# Patient Record
Sex: Female | Born: 1945 | Race: White | Hispanic: No | Marital: Married | Smoking: Current every day smoker
Health system: Southern US, Community
[De-identification: ages and names within clinical notes are randomized; demographics above are authoritative.]

## PROBLEM LIST (undated history)

## (undated) DIAGNOSIS — F419 Anxiety disorder, unspecified: Secondary | ICD-10-CM

## (undated) DIAGNOSIS — F329 Major depressive disorder, single episode, unspecified: Secondary | ICD-10-CM

## (undated) DIAGNOSIS — T8859XA Other complications of anesthesia, initial encounter: Secondary | ICD-10-CM

## (undated) DIAGNOSIS — Z972 Presence of dental prosthetic device (complete) (partial): Secondary | ICD-10-CM

## (undated) DIAGNOSIS — N189 Chronic kidney disease, unspecified: Secondary | ICD-10-CM

## (undated) DIAGNOSIS — F32A Depression, unspecified: Secondary | ICD-10-CM

## (undated) DIAGNOSIS — Z973 Presence of spectacles and contact lenses: Secondary | ICD-10-CM

## (undated) DIAGNOSIS — D649 Anemia, unspecified: Secondary | ICD-10-CM

## (undated) DIAGNOSIS — J449 Chronic obstructive pulmonary disease, unspecified: Secondary | ICD-10-CM

## (undated) DIAGNOSIS — N1832 Chronic kidney disease, stage 3b: Secondary | ICD-10-CM

## (undated) DIAGNOSIS — R112 Nausea with vomiting, unspecified: Secondary | ICD-10-CM

## (undated) DIAGNOSIS — Z9889 Other specified postprocedural states: Secondary | ICD-10-CM

## (undated) DIAGNOSIS — T4145XA Adverse effect of unspecified anesthetic, initial encounter: Secondary | ICD-10-CM

## (undated) DIAGNOSIS — J45909 Unspecified asthma, uncomplicated: Secondary | ICD-10-CM

## (undated) DIAGNOSIS — I10 Essential (primary) hypertension: Secondary | ICD-10-CM

## (undated) HISTORY — PX: HAMMER TOE SURGERY: SHX385

## (undated) HISTORY — PX: BREAST BIOPSY: SHX20

## (undated) HISTORY — PX: HERNIA REPAIR: SHX51

## (undated) HISTORY — PX: TONSILLECTOMY: SUR1361

## (undated) HISTORY — PX: TUBAL LIGATION: SHX77

---

## 2005-08-11 ENCOUNTER — Ambulatory Visit: Payer: Self-pay | Admitting: Internal Medicine

## 2008-06-26 ENCOUNTER — Ambulatory Visit: Payer: Self-pay | Admitting: Family Medicine

## 2011-03-17 ENCOUNTER — Ambulatory Visit: Payer: Self-pay | Admitting: Urology

## 2013-05-22 ENCOUNTER — Ambulatory Visit: Payer: Self-pay | Admitting: Urology

## 2013-05-22 DIAGNOSIS — I10 Essential (primary) hypertension: Secondary | ICD-10-CM

## 2013-05-27 ENCOUNTER — Ambulatory Visit: Payer: Self-pay | Admitting: Urology

## 2014-12-15 ENCOUNTER — Ambulatory Visit: Payer: Self-pay | Admitting: Family Medicine

## 2015-04-10 NOTE — H&P (Signed)
PATIENT NAME:  Kara Garza, Kara Garza MR#:  [id number] DATE OF BIRTH:  11-May-1946  DATE OF ADMISSION:  05/27/2013  CHIEF COMPLAINT: Urinary incontinence.   HISTORY OF PRESENT ILLNESS: Kara Garza is a 69 year old white female with a greater than 2-year history of stress urinary incontinence. She wears 3 to 4 midsized pads in a 24-hour period. She comes in now for Macroplastique injection. She denied history of urgency or urgency incontinence or urinary tract infections.   ALLERGIES: THE PATIENT IS ALLERGIC TO IV CONTRAST.   CURRENT MEDICATIONS: Include venlafaxine, lisinopril and aspirin.   PRIOR SURGICAL PROCEDURES: Included:  1. Tonsillectomy as a child.  2. Breast biopsy for benign disease in 1970s.  3. Repair of right femoral hernia in 1976.   SOCIAL HISTORY: She smokes a half-pack a day and has a 10 pack-year history. She denied alcohol use.   FAMILY HISTORY: Remarkable for father with diabetes and mother with lung cancer.   PAST AND CURRENT MEDICAL CONDITIONS:  1. Hypertension.  2. Anxiety.   REVIEW OF SYSTEMS: The patient has a history of allergic rhinitis and chronic low back pain. She has hot flashes. She denied weight loss, fatigue, fever, chills, night sweats, shortness of breath, diabetes, stroke or coronary artery disease.   PHYSICAL EXAMINATION:  GENERAL: A well-nourished white female in no distress.  HEENT: Sclerae were clear. Pupils were equally round and reactive to light and accommodation. Extraocular movements were intact.  NECK: Supple. No palpable cervical adenopathy. No audible carotid bruits.  LUNGS: Clear to auscultation.  CARDIOVASCULAR: Regular rhythm and rate without audible murmurs or gallops.  ABDOMEN: Soft, nontender abdomen.  GENITOURINARY: Normal external genitalia without vaginal discharge. She had good anterior support with minimal urethral hypermobility. She did have a positive Marshall test. Cervix was smooth without visible lesions. Uterus was smooth without  palpable nodules. No palpable adnexal masses.  RECTAL: No palpable rectal masses.  NEUROMUSCULAR: Alert and oriented x3.   IMPRESSION: Stress urinary incontinence.   PLAN: Macroplastique injection.     ____________________________ Suszanne ConnersMichael R. Evelene CroonWolff, MD mrw:OSi D: 05/22/2013 12:57:51 ET T: 05/22/2013 13:12:36 ET JOB#: [id number]  cc: Suszanne ConnersMichael R. Evelene CroonWolff, MD, <Dictator> Orson ApeMICHAEL R Anelis Hrivnak MD ELECTRONICALLY SIGNED 05/23/2013 8:56

## 2015-04-10 NOTE — Op Note (Signed)
PATIENT NAME:  Kara Garza, Kara Garza MR#:  [id number] DATE OF BIRTH:  June 20, 1946  DATE OF PROCEDURE:  05/27/2013  PREOPERATIVE DIAGNOSIS: Urinary incontinence.   POSTOPERATIVE DIAGNOSIS: Urinary incontinence.   PROCEDURE: Cystoscopy with Macroplastique implant.   SURGEON: Anola GurneyMichael Wolff, Garza.D.   ANESTHETISTS: Fabian Novemberhomas and Wolff.  ANESTHETIC METHOD: General per Maisie Fushomas and local per Evelene CroonWolff.   INDICATIONS: See the dictated history and physical. After informed consent, the patient requests the above procedure.   OPERATIVE SUMMARY: After adequate general anesthesia had been obtained, the patient was placed into dorsal lithotomy position and the perineum was prepped and draped in the usual fashion. The 17-French cystoscope was then coupled to the camera and then placed into the bladder. The bladder was thoroughly examined. Both ureteral orifices were identified and had clear efflux. No bladder mucosal lesions were identified. The cystoscope was then positioned in the mid urethra and the Macroplastique needle was passed.  Initial puncture was performed at the 12 o'clock position and 2.5 mL of Macroplastique was injected here. There was good elevation of the mucosa. The next injection was performed at the 6 o'clock position and an additional 2.5 mL injected here. At this point, there was complete coaptation of the bladder neck and mid urethra. The scope was then removed. Bladder was manually compressed to simulate Valsalva maneuver with full bladder. No leakage could be elicited. At this point, the bladder was drained with a 10-French red Robinson catheter. 10 mL of viscous Xylocaine was then instilled within the urethra and B and O suppository was placed. The procedure was then terminated, and the patient was transferred to the recovery room in stable condition.  ____________________________ Suszanne ConnersMichael R. Evelene CroonWolff, MD mrw:sb D: 05/27/2013 08:31:03 ET T: 05/27/2013 08:57:09 ET JOB#: [id number]  cc: Suszanne ConnersMichael R. Evelene CroonWolff, MD,  <Dictator> Orson ApeMICHAEL R WOLFF MD ELECTRONICALLY SIGNED 05/27/2013 12:31

## 2015-07-30 ENCOUNTER — Other Ambulatory Visit: Payer: Self-pay | Admitting: Urology

## 2015-07-30 DIAGNOSIS — N23 Unspecified renal colic: Secondary | ICD-10-CM

## 2015-07-30 DIAGNOSIS — N200 Calculus of kidney: Secondary | ICD-10-CM

## 2015-07-31 ENCOUNTER — Ambulatory Visit
Admission: RE | Admit: 2015-07-31 | Discharge: 2015-07-31 | Disposition: A | Discharge status: Home / Self Care | Payer: Medicare Other | Source: Ambulatory Visit | Attending: Urology | Admitting: Urology

## 2015-07-31 DIAGNOSIS — N200 Calculus of kidney: Secondary | ICD-10-CM

## 2015-07-31 DIAGNOSIS — N23 Unspecified renal colic: Secondary | ICD-10-CM | POA: Diagnosis present

## 2015-07-31 DIAGNOSIS — N281 Cyst of kidney, acquired: Secondary | ICD-10-CM | POA: Diagnosis not present

## 2015-12-02 DIAGNOSIS — F419 Anxiety disorder, unspecified: Secondary | ICD-10-CM | POA: Insufficient documentation

## 2016-04-05 ENCOUNTER — Other Ambulatory Visit: Payer: Medicare Other

## 2016-04-05 ENCOUNTER — Encounter: Payer: Self-pay | Admitting: *Deleted

## 2016-04-05 NOTE — Patient Instructions (Signed)
  Your procedure is scheduled on:04-12-16 (TUESDAY) Report to [facility] SAME [facility] SURGERY 2ND FLOOR To find out your arrival time please call [phone] between 1PM - 3PM on 04-11-16 (MONDAY)  Remember: Instructions that are not followed completely may result in serious medical risk, up to and including death, or upon the discretion of your surgeon and anesthesiologist your surgery may need to be rescheduled.    _X___ 1. Do not eat food or drink liquids after midnight. No gum chewing or hard candies.     _X___ 2. No Alcohol for 24 hours before or after surgery.   ____ 3. Bring all medications with you on the day of surgery if instructed.    _X___ 4. Notify your doctor if there is any change in your medical condition     (cold, fever, infections).     Do not wear jewelry, make-up, hairpins, clips or nail polish.  Do not wear lotions, powders, or perfumes. You may wear deodorant.  Do not shave 48 hours prior to surgery. Men may shave face and neck.  Do not bring valuables to the hospital.    [facility] is not responsible for any belongings or valuables.               Contacts, dentures or bridgework may not be worn into surgery.  Leave your suitcase in the car. After surgery it may be brought to your room.  For patients admitted to the hospital, discharge time is determined by your treatment team.   Patients discharged the day of surgery will not be allowed to drive home.   Please read over the following fact sheets that you were given:     _X___ Take these medicines the morning of surgery with A SIP OF WATER:    1. VENLAFAXINE  2.   3.   4.  5.  6.  ____ Fleet Enema (as directed)   ____ Use CHG Soap as directed  ____ Use inhalers on the day of surgery  ____ Stop metformin 2 days prior to surgery    ____ Take 1/2 of usual insulin dose the night before surgery and none on the morning of surgery.   _X___ Stop Coumadin/Plavix/aspirin-STOP ASPIRIN NOW  _X___ Stop  Anti-inflammatories-NO NSAIDS OR ASPIRIN PRODUCTS-TYLENOL OK TO TAKE   ____ Stop supplements until after surgery.    ____ Bring C-Pap to the hospital.

## 2016-04-06 ENCOUNTER — Encounter
Admission: RE | Admit: 2016-04-06 | Discharge: 2016-04-06 | Disposition: A | Discharge status: Home / Self Care | Payer: Medicare Other | Source: Ambulatory Visit | Attending: Physical Medicine and Rehabilitation | Admitting: Physical Medicine and Rehabilitation

## 2016-04-06 DIAGNOSIS — Z01810 Encounter for preprocedural cardiovascular examination: Secondary | ICD-10-CM | POA: Insufficient documentation

## 2016-04-06 LAB — POTASSIUM: Potassium: 3.9 mmol/L (ref 3.5–5.1)

## 2016-04-08 NOTE — H&P (Signed)
NAME:  Garza, Kara                       ACCOUNT NO.:  MEDICAL RECORD NO.:  [id number]  LOCATION:                                 FACILITY:  PHYSICIAN:  Anola GurneyMichael Brnadon Eoff          DATE OF BIRTH:  [birth year]  DATE OF ADMISSION: DATE OF DISCHARGE:                            HISTORY AND PHYSICAL   SAME DAY SURGERY:  April 12, 2016.  CHIEF COMPLAINT:  Urinary incontinence.  HISTORY OF PRESENT ILLNESS:  [name] is a 70 year old Caucasian female with a 6654-month history of stress urinary incontinence.  The incontinence occurs with coughing, sneezing, and straining.  She wears 1 to 2 Poise pads per day.  She does not have significant urge incontinence.  She does have some urgency.  OAB symptom score was 9.  She voids 2 times per night.  She comes in now for Macroplastique injection.  PAST MEDICAL HISTORY:  The patient is allergic to IV contrast.  CURRENT MEDICATIONS:  Include lisinopril, HCTZ, venlafaxine, vitamin D, and aspirin.  PREVIOUS SURGICAL PROCEDURES:  Include: 1. Tonsillectomy as a child. 2. Breast biopsy for benign disease in 1970s. 3. Repair of a right femoral hernia, 1976. 4. Macroplastique injection, 2014. 5. Repair of hammer toes, 1995.  PAST AND CURRENT MEDICAL CONDITIONS: 1. Hypertension. 2. Anxiety.  REVIEW OF SYSTEMS:  The patient has a history of allergic rhinitis and chronic low back pain.  She has hot flashes.  She denies chest pain, shortness of breath, diabetes, stroke, or heart disease.  PHYSICAL EXAMINATION:  GENERAL:  Well-nourished white female, in no acute distress. HEENT:  Sclerae were clear.  Pupils were equally round, reactive to light and accommodation.  Extraocular movements were intact. NECK:  Supple.  No palpable cervical adenopathy. LUNGS:  Clear to auscultation. CARDIOVASCULAR:  Regular rhythm and rate without audible murmurs. ABDOMINAL:  Soft, nontender abdomen. GU:  Revealed atrophic external genitalia.  She had a fixed urethra. She did  not have a cystocele.  IMPRESSION:  Type 3 stress incontinence.  PLAN:  Macroplastique injection.          ______________________________ Anola GurneyMichael Rollie Hynek     MW/MEDQ  D:  04/07/2016  T:  04/07/2016  Job:  [id number]

## 2016-04-12 ENCOUNTER — Ambulatory Visit: Payer: Medicare Other | Admitting: Anesthesiology

## 2016-04-12 ENCOUNTER — Encounter: Payer: Self-pay | Admitting: *Deleted

## 2016-04-12 ENCOUNTER — Ambulatory Visit
Admission: RE | Admit: 2016-04-12 | Discharge: 2016-04-12 | Disposition: A | Discharge status: Home / Self Care | Payer: Medicare Other | Source: Ambulatory Visit | Attending: Urology | Admitting: Urology

## 2016-04-12 ENCOUNTER — Encounter
Admission: RE | Disposition: A | Discharge status: Home / Self Care | Payer: Self-pay | Source: Ambulatory Visit | Attending: Urology

## 2016-04-12 DIAGNOSIS — Z9889 Other specified postprocedural states: Secondary | ICD-10-CM | POA: Insufficient documentation

## 2016-04-12 DIAGNOSIS — I10 Essential (primary) hypertension: Secondary | ICD-10-CM | POA: Insufficient documentation

## 2016-04-12 DIAGNOSIS — R32 Unspecified urinary incontinence: Secondary | ICD-10-CM | POA: Diagnosis present

## 2016-04-12 DIAGNOSIS — Z79899 Other long term (current) drug therapy: Secondary | ICD-10-CM | POA: Insufficient documentation

## 2016-04-12 DIAGNOSIS — Z7982 Long term (current) use of aspirin: Secondary | ICD-10-CM | POA: Diagnosis not present

## 2016-04-12 DIAGNOSIS — F419 Anxiety disorder, unspecified: Secondary | ICD-10-CM | POA: Insufficient documentation

## 2016-04-12 DIAGNOSIS — N393 Stress incontinence (female) (male): Secondary | ICD-10-CM | POA: Insufficient documentation

## 2016-04-12 DIAGNOSIS — Z91041 Radiographic dye allergy status: Secondary | ICD-10-CM | POA: Insufficient documentation

## 2016-04-12 HISTORY — DX: Chronic obstructive pulmonary disease, unspecified: J44.9

## 2016-04-12 HISTORY — DX: Anxiety disorder, unspecified: F41.9

## 2016-04-12 HISTORY — DX: Unspecified asthma, uncomplicated: J45.909

## 2016-04-12 HISTORY — DX: Depression, unspecified: F32.A

## 2016-04-12 HISTORY — DX: Anemia, unspecified: D64.9

## 2016-04-12 HISTORY — DX: Chronic kidney disease, unspecified: N18.9

## 2016-04-12 HISTORY — DX: Nausea with vomiting, unspecified: R11.2

## 2016-04-12 HISTORY — DX: Essential (primary) hypertension: I10

## 2016-04-12 HISTORY — PX: CYSTOSCOPY MACROPLASTIQUE IMPLANT: SHX6636

## 2016-04-12 HISTORY — DX: Other specified postprocedural states: Z98.890

## 2016-04-12 HISTORY — DX: Adverse effect of unspecified anesthetic, initial encounter: T41.45XA

## 2016-04-12 HISTORY — DX: Other complications of anesthesia, initial encounter: T88.59XA

## 2016-04-12 HISTORY — DX: Major depressive disorder, single episode, unspecified: F32.9

## 2016-04-12 HISTORY — DX: Nausea with vomiting, unspecified: Z98.890

## 2016-04-12 SURGERY — CYSTOSCOPY, WITH MACROPLASTIQUE INJECTION
Anesthesia: General | Wound class: Clean Contaminated

## 2016-04-12 MED ORDER — FAMOTIDINE 20 MG PO TABS
20.0000 mg | ORAL_TABLET | Freq: Once | ORAL | Status: AC
  Administered 2016-04-12: 20 mg via ORAL

## 2016-04-12 MED ORDER — DEXAMETHASONE SODIUM PHOSPHATE 10 MG/ML IJ SOLN
INTRAMUSCULAR | Status: DC | PRN
  Administered 2016-04-12: 5 mg via INTRAVENOUS

## 2016-04-12 MED ORDER — MIDAZOLAM HCL 5 MG/5ML IJ SOLN
INTRAMUSCULAR | Status: DC | PRN
  Administered 2016-04-12: 2 mg via INTRAVENOUS

## 2016-04-12 MED ORDER — PROPOFOL 10 MG/ML IV BOLUS
INTRAVENOUS | Status: DC | PRN
  Administered 2016-04-12: 150 mg via INTRAVENOUS
  Administered 2016-04-12: 30 mg via INTRAVENOUS

## 2016-04-12 MED ORDER — ONDANSETRON HCL 4 MG/2ML IJ SOLN
INTRAMUSCULAR | Status: DC | PRN
  Administered 2016-04-12: 4 mg via INTRAVENOUS

## 2016-04-12 MED ORDER — LACTATED RINGERS IV SOLN
INTRAVENOUS | Status: DC
  Administered 2016-04-12: 13:00:00 via INTRAVENOUS

## 2016-04-12 MED ORDER — LIDOCAINE HCL (CARDIAC) 20 MG/ML IV SOLN
INTRAVENOUS | Status: DC | PRN
  Administered 2016-04-12: 60 mg via INTRAVENOUS

## 2016-04-12 MED ORDER — UROGESIC-BLUE 81.6 MG PO TABS: 1.0000 | ORAL_TABLET | Freq: Four times a day (QID) | ORAL | Status: DC | PRN

## 2016-04-12 MED ORDER — FENTANYL CITRATE (PF) 100 MCG/2ML IJ SOLN: 25.0000 ug | INTRAMUSCULAR | Status: DC | PRN

## 2016-04-12 MED ORDER — BELLADONNA ALKALOIDS-OPIUM 16.2-60 MG RE SUPP
RECTAL | Status: DC | PRN
  Administered 2016-04-12: 1 via RECTAL

## 2016-04-12 MED ORDER — CEFAZOLIN SODIUM 1-5 GM-% IV SOLN
1.0000 g | Freq: Once | INTRAVENOUS | Status: AC
  Administered 2016-04-12: 1 g via INTRAVENOUS

## 2016-04-12 MED ORDER — ONDANSETRON HCL 4 MG/2ML IJ SOLN: 4.0000 mg | Freq: Once | INTRAMUSCULAR | Status: DC | PRN

## 2016-04-12 MED ORDER — LIDOCAINE HCL 2 % EX GEL
CUTANEOUS | Status: DC | PRN
  Administered 2016-04-12: 1 via URETHRAL

## 2016-04-12 MED ORDER — BELLADONNA ALKALOIDS-OPIUM 16.2-60 MG RE SUPP
RECTAL | Status: AC
  Filled 2016-04-12: qty 1

## 2016-04-12 MED ORDER — FENTANYL CITRATE (PF) 100 MCG/2ML IJ SOLN
INTRAMUSCULAR | Status: DC | PRN
  Administered 2016-04-12 (×4): 25 ug via INTRAVENOUS

## 2016-04-12 MED ORDER — LIDOCAINE HCL 2 % EX GEL
CUTANEOUS | Status: AC
  Filled 2016-04-12: qty 10

## 2016-04-12 SURGICAL SUPPLY — 21 items
BAG DRAIN CYSTO-URO LG1000N (MISCELLANEOUS) ×3 IMPLANT
CATH ROBINSON RED A/P 12FR (CATHETERS) ×3 IMPLANT
GLOVE BIO SURGEON STRL SZ7 (GLOVE) ×4 IMPLANT
GLOVE BIO SURGEON STRL SZ7.5 (GLOVE) ×3 IMPLANT
GOWN STRL REUS W/ TWL LRG LVL3 (GOWN DISPOSABLE) ×1 IMPLANT
GOWN STRL REUS W/ TWL XL LVL3 (GOWN DISPOSABLE) ×1 IMPLANT
GOWN STRL REUS W/TWL LRG LVL3 (GOWN DISPOSABLE) ×3
GOWN STRL REUS W/TWL XL LVL3 (GOWN DISPOSABLE) ×3
INJECTION MACROPLASIQ 2.5ML UN (Female Continence) ×6 IMPLANT
IV NS 1000ML (IV SOLUTION) ×3
IV NS 1000ML BAXH (IV SOLUTION) IMPLANT
KIT RM TURNOVER CYSTO AR (KITS) ×3 IMPLANT
MACROPLASTIQUE 2.5ML UNIT (Female Continence) ×6 IMPLANT
NDL ENDOSCOPIC URO 20G (NEEDLE) ×3 IMPLANT
OINTMENT BETADINE 1.5GM (MISCELLANEOUS) ×1 IMPLANT
PACK CYSTO AR (MISCELLANEOUS) ×3 IMPLANT
PREP PVP WINGED SPONGE (MISCELLANEOUS) ×3 IMPLANT
SET CYSTO W/LG BORE CLAMP LF (SET/KITS/TRAYS/PACK) ×3 IMPLANT
SURGILUBE 2OZ TUBE FLIPTOP (MISCELLANEOUS) ×3 IMPLANT
WATER STERILE IRR 1000ML POUR (IV SOLUTION) ×3 IMPLANT
WATER STERILE IRR 3000ML UROMA (IV SOLUTION) ×1 IMPLANT

## 2016-04-12 NOTE — Discharge Instructions (Addendum)
Urinary Incontinence Urinary incontinence is the involuntary loss of urine from your bladder. CAUSES  There are many causes of urinary incontinence. They include:  Medicines.  Infections.  Prostatic enlargement, leading to overflow of urine from your bladder.  Surgery.  Neurological diseases.  Emotional factors. SIGNS AND SYMPTOMS Urinary Incontinence can be divided into four types: 1. Urge incontinence. Urge incontinence is the involuntary loss of urine before you have the opportunity to go to the bathroom. There is a sudden urge to void but not enough time to reach a bathroom. 2. Stress incontinence. Stress incontinence is the sudden loss of urine with any activity that forces urine to pass. It is commonly caused by anatomical changes to the pelvis and sphincter areas of your body. 3. Overflow incontinence. Overflow incontinence is the loss of urine from an obstructed opening to your bladder. This results in a backup of urine and a resultant buildup of pressure within the bladder. When the pressure within the bladder exceeds the closing pressure of the sphincter, the urine overflows, which causes incontinence, similar to water overflowing a dam. 4. Total incontinence. Total incontinence is the loss of urine as a result of the inability to store urine within your bladder. DIAGNOSIS  Evaluating the cause of incontinence may require:  A thorough and complete medical and obstetric history.  A complete physical exam.  Laboratory tests such as a urine culture and sensitivities. When additional tests are indicated, they can include:  An ultrasound exam.  Kidney and bladder X-rays.  Cystoscopy. This is an exam of the bladder using a narrow scope.  Urodynamic testing to test the nerve function to the bladder and sphincter areas. TREATMENT  Treatment for urinary incontinence depends on the cause:  For urge incontinence caused by a bacterial infection, antibiotics will be prescribed.  If the urge incontinence is related to medicines you take, your health care provider may have you change the medicine.  For stress incontinence, surgery to re-establish anatomical support to the bladder or sphincter, or both, will often correct the condition.  For overflow incontinence caused by an enlarged prostate, an operation to open the channel through the enlarged prostate will allow the flow of urine out of the bladder. In women with fibroids, a hysterectomy may be recommended.  For total incontinence, surgery on your urinary sphincter may help. An artificial urinary sphincter (an inflatable cuff placed around the urethra) may be required. In women who have developed a hole-like passage between their bladder and vagina (vesicovaginal fistula), surgery to close the fistula often is required. HOME CARE INSTRUCTIONS  Normal daily hygiene and the use of pads or adult diapers that are changed regularly will help prevent odors and skin damage.  Avoid caffeine. It can overstimulate your bladder.  Use the bathroom regularly. Try about every 2-3 hours to go to the bathroom, even if you do not feel the need to do so. Take time to empty your bladder completely. After urinating, wait a minute. Then try to urinate again.  For causes involving nerve dysfunction, keep a log of the medicines you take and a journal of the times you go to the bathroom. SEEK MEDICAL CARE IF:  You experience worsening of pain instead of improvement in pain after your procedure.  Your incontinence becomes worse instead of better. SEE IMMEDIATE MEDICAL CARE IF:  You experience fever or shaking chills.  You are unable to pass your urine.  You have redness spreading into your groin or down into your thighs. MAKE SURE  YOU:   Understand these instructions.   Will watch your condition.  Will get help right away if you are not doing well or get worse.   This information is not intended to replace advice given to you  by your health care provider. Make sure you discuss any questions you have with your health care provider.   Document Released: 01/12/2005 Document Revised: 12/26/2014 Document Reviewed: 05/14/2013 Elsevier Interactive Patient Education 2016 [employer] Collagen Implant for Urinary Incontinence, Care After Read the instructions outlined below and refer to this sheet in the next few weeks. These discharge instructions provide you with general information on caring for yourself after you leave the hospital. Your caregiver may also give you specific instructions. While your treatment has been planned according to the most current medical practices available, unavoidable complications occasionally occur. If you have any problems or questions after discharge, call your caregiver. ACTIVITY  You may resume your regular activity.  Talk with your caregiver about when you may return to work.  You may shower. NUTRITION 5. You may resume your normal diet. 6. Drink plenty of fluids (6-8 glasses a day). 7. Eat a well-balanced diet. 8. Call your doctor if you have persistent nausea or vomiting. FEVER  If you feel feverish or have shaking chills, take your temperature. If it is 102 F (38.9 C), call your doctor.  Fever may mean there is an infection. PAIN CONTROL  Only take over-the-counter or prescription medicines for pain, discomfort, or fever as directed by your caregiver. SYMPTOMS AFTER SURGERY You may experience one or more of the following symptoms after your implant procedure. These symptoms should be only temporary:  Inability to urinate or a change in your usual pattern of urination.  Slight bleeding at the urethra or blood in the urine.  Soreness in the injection site area.  Nausea. SEXUAL INTERCOURSE Check with your doctor at your follow-up visit about resuming sexual intercourse. SEEK IMMEDIATE MEDICAL CARE IF:   Cloudy foul-smelling urine.  Fever greater than 102 F (38.9  C).  Joint pain or joint swelling.  Skin rash or skin thickening.  Muscle aches.  Weakness or fatigue.  You are unable to urinate.   This information is not intended to replace advice given to you by your health care provider. Make sure you discuss any questions you have with your health care provider.   Document Released: 07/19/2004 Document Revised: 09/25/2013 Document Reviewed: 11/05/2014 Elsevier Interactive Patient Education 2016 [employer] Inc[employer]   AMBULATORY SURGERY  DISCHARGE INSTRUCTIONS   1) The drugs that you were given will stay in your system until tomorrow so for the next 24 hours you should not:  A) Drive an automobile B) Make any legal decisions C) Drink any alcoholic beverage   2) You may resume regular meals tomorrow.  Today it is better to start with liquids and gradually work up to solid foods.  You may eat anything you prefer, but it is better to start with liquids, then soup and crackers, and gradually work up to solid foods.   3) Please notify your doctor immediately if you have any unusual bleeding, trouble breathing, redness and pain at the surgery site, drainage, fever, or pain not relieved by medication.    4) Additional Instructions:        Please contact your physician with any problems or Same Day Surgery at [phone], Monday through Friday 6 am to 4 pm, or [place] at [facility] Main number at [phone].

## 2016-04-12 NOTE — Anesthesia Postprocedure Evaluation (Signed)
Anesthesia Post Note  Patient: Nyoka CowdenLaura H Hovatter  Procedure(s) Performed: Procedure(s) (LRB): CYSTOSCOPY MACROPLASTIQUE IMPLANT (N/A)  Patient location during evaluation: PACU Anesthesia Type: General Level of consciousness: awake and alert Pain management: pain level controlled Vital Signs Assessment: post-procedure vital signs reviewed and stable Respiratory status: spontaneous breathing, nonlabored ventilation, respiratory function stable and patient connected to nasal cannula oxygen Cardiovascular status: blood pressure returned to baseline and stable Postop Assessment: no signs of nausea or vomiting Anesthetic complications: no    Last Vitals:  Filed Vitals:   04/12/16 1445 04/12/16 1500  BP: 148/73 147/55  Pulse: 73 81  Temp:  36.6 C  Resp: 15 14    Last Pain:  Filed Vitals:   04/12/16 1502  PainSc: 0-No pain                 Lenard SimmerAndrew Radames Mejorado

## 2016-04-12 NOTE — Op Note (Signed)
Preoperative diagnosis: Urinary incontinence   Postoperative diagnosis: Same  Procedure: Macroplastique injection   Surgeon: Suszanne ConnersMichael R. Evelene CroonWolff MD, FACS Anesthesia: Gen.  Indications:See the history and physical. After informed consent the above procedure(s) were requested     Technique and findings: After adequate general anesthesia had been obtained the patient was placed into dorsal lithotomy position and the perineum was prepped and draped in the usual fashion. The 3921 French the scope was coupled the camera and then advanced into the bladder. The bladder was thoroughly inspected. Both ureteral orifices were identified and had clear reflux. No bladder tumors were identified. The scope was positioned in mid urethra and the urethral mucosa did not to coapt. The Macroplastique needle was introduced through the scope and initial puncture performed at 3:00 position. 2 cc of plastic was injected submucosally and there was good elevation of the mucosa on this side. A second puncture was performed at the 9:00 position and 2 cc injected here. There was good elevation on this side as well. A third puncture was performed at the exit o'clock position and 1 cc injected here which resulted in complete coaptation of the mucosa circumferentially. At this point the scope was removed. The bladder was compressed manually and stress incontinence could not be elicited. A 10 French red Robinson catheter was placed and the bladder was drained. 10 cc of viscous Xylocaine was instilled within the urethra. A B&O suppository was placed. The procedure was then terminated and patient transferred to the recovery room in stable condition.

## 2016-04-12 NOTE — H&P (Signed)
Date of Initial H&P: 04/07/16  History reviewed, patient examined, no change in status, stable for surgery.

## 2016-04-12 NOTE — Anesthesia Procedure Notes (Signed)
Procedure Name: LMA Insertion Date/Time: 04/12/2016 1:56 PM Performed by: Lily KocherPERALTA, Kara Courtney Pre-anesthesia Checklist: Patient identified, Patient being monitored, Timeout performed, Emergency Drugs available and Suction available Patient Re-evaluated:Patient Re-evaluated prior to inductionOxygen Delivery Method: Circle system utilized Preoxygenation: Pre-oxygenation with 100% oxygen Intubation Type: IV induction Ventilation: Mask ventilation without difficulty LMA: LMA inserted LMA Size: 3.0 Tube type: Oral Number of attempts: 1 Placement Confirmation: positive ETCO2 Tube secured with: Tape Dental Injury: Teeth and Oropharynx as per pre-operative assessment

## 2016-04-12 NOTE — Transfer of Care (Signed)
Immediate Anesthesia Transfer of Care Note  Patient: Kara CowdenLaura H Garza  Procedure(s) Performed: Procedure(s): CYSTOSCOPY MACROPLASTIQUE IMPLANT (N/A)  Patient Location: PACU  Anesthesia Type:General  Level of Consciousness: sedated  Airway & Oxygen Therapy: Patient Spontanous Breathing and Patient connected to face mask oxygen  Post-op Assessment: Report given to RN  Post vital signs: Reviewed and stable  Last Vitals:  Filed Vitals:   04/12/16 1249 04/12/16 1430  BP: 181/72 185/81  Pulse: 82 79  Temp: 36.7 C 36.2 C  Resp: 16 23    Complications: No apparent anesthesia complications

## 2016-04-12 NOTE — OR Nursing (Signed)
Glasses and dentures given back to patient.

## 2016-04-12 NOTE — Anesthesia Preprocedure Evaluation (Signed)
Anesthesia Evaluation  Patient identified by MRN, date of birth, ID band Patient awake    Reviewed: Allergy & Precautions, H&P , NPO status , Patient's Chart, lab work & pertinent test results, reviewed documented beta blocker date and time   History of Anesthesia Complications (+) PONV and history of anesthetic complications  Airway Mallampati: II  TM Distance: >3 FB Neck ROM: full    Dental no notable dental hx. (+) Partial Lower, Missing, Caps, Teeth Intact   Pulmonary neg shortness of breath, asthma , neg sleep apnea, COPD, Recent URI , Residual Cough, Current Smoker,    Pulmonary exam normal breath sounds clear to auscultation       Cardiovascular Exercise Tolerance: Good hypertension, On Medications (-) angina(-) CAD, (-) Past MI, (-) Cardiac Stents and (-) CABG Normal cardiovascular exam(-) dysrhythmias (-) Valvular Problems/Murmurs Rhythm:regular Rate:Normal     Neuro/Psych PSYCHIATRIC DISORDERS (Depression) negative neurological ROS     GI/Hepatic negative GI ROS, Neg liver ROS,   Endo/Other  negative endocrine ROS  Renal/GU CRFRenal disease  negative genitourinary   Musculoskeletal   Abdominal   Peds  Hematology negative hematology ROS (+)   Anesthesia Other Findings Past Medical History:   Hypertension                                                 Anxiety                                                      Depression                                                   Chronic kidney disease                                         Comment:H/O KIDNEY STONES   Anemia                                                         Comment:H/O   Complication of anesthesia                                   PONV (postoperative nausea and vomiting)                       Comment:NAUSEATED   COPD (chronic obstructive pulmonary disease) (*                Comment:PT DENIES BUT PCP HAS LISTED ON H&P   Asthma  Comment:PT DENIES BUT PCP HAS LISTED ON H&P   Reproductive/Obstetrics negative OB ROS                             Anesthesia Physical Anesthesia Plan  ASA: III  Anesthesia Plan: General   Post-op Pain Management:    Induction:   Airway Management Planned:   Additional Equipment:   Intra-op Plan:   Post-operative Plan:   Informed Consent: I have reviewed the patients History and Physical, chart, labs and discussed the procedure including the risks, benefits and alternatives for the proposed anesthesia with the patient or authorized representative who has indicated his/her understanding and acceptance.   Dental Advisory Given  Plan Discussed with: Anesthesiologist, CRNA and Surgeon  Anesthesia Plan Comments:         Anesthesia Quick Evaluation

## 2018-07-02 ENCOUNTER — Other Ambulatory Visit: Payer: Self-pay | Admitting: Family Medicine

## 2018-07-02 DIAGNOSIS — Z1231 Encounter for screening mammogram for malignant neoplasm of breast: Secondary | ICD-10-CM

## 2019-01-02 ENCOUNTER — Ambulatory Visit
Admission: EM | Admit: 2019-01-02 | Discharge: 2019-01-02 | Disposition: A | Discharge status: Home / Self Care | Payer: Medicare Other | Attending: Family Medicine | Admitting: Family Medicine

## 2019-01-02 ENCOUNTER — Other Ambulatory Visit: Payer: Self-pay

## 2019-01-02 DIAGNOSIS — R05 Cough: Secondary | ICD-10-CM | POA: Diagnosis not present

## 2019-01-02 DIAGNOSIS — J069 Acute upper respiratory infection, unspecified: Secondary | ICD-10-CM | POA: Insufficient documentation

## 2019-01-02 DIAGNOSIS — F17210 Nicotine dependence, cigarettes, uncomplicated: Secondary | ICD-10-CM | POA: Diagnosis not present

## 2019-01-02 DIAGNOSIS — J441 Chronic obstructive pulmonary disease with (acute) exacerbation: Secondary | ICD-10-CM | POA: Insufficient documentation

## 2019-01-02 DIAGNOSIS — B9789 Other viral agents as the cause of diseases classified elsewhere: Secondary | ICD-10-CM | POA: Insufficient documentation

## 2019-01-02 MED ORDER — ALBUTEROL SULFATE HFA 108 (90 BASE) MCG/ACT IN AERS: 2.0000 | INHALATION_SPRAY | RESPIRATORY_TRACT | 0 refills | Status: DC | PRN

## 2019-01-02 MED ORDER — BENZONATATE 100 MG PO CAPS: 100.0000 mg | ORAL_CAPSULE | Freq: Three times a day (TID) | ORAL | 0 refills | Status: DC | PRN

## 2019-01-02 MED ORDER — PREDNISONE 10 MG PO TABS: ORAL_TABLET | ORAL | 0 refills | Status: DC

## 2019-01-02 MED ORDER — HYDROCOD POLST-CPM POLST ER 10-8 MG/5ML PO SUER: 5.0000 mL | Freq: Every evening | ORAL | 0 refills | Status: DC | PRN

## 2019-01-02 MED ORDER — DOXYCYCLINE HYCLATE 100 MG PO CAPS: 100.0000 mg | ORAL_CAPSULE | Freq: Two times a day (BID) | ORAL | 0 refills | Status: DC

## 2019-01-02 NOTE — ED Triage Notes (Signed)
"  I have bronchitis"  Coughing x one week of green sputum

## 2019-01-02 NOTE — Discharge Instructions (Addendum)
Take medication as prescribed. Rest. Drink plenty of fluids.  ° °Follow up with your primary care physician this week as needed. Return to Urgent care for new or worsening concerns.  ° °

## 2019-01-02 NOTE — ED Provider Notes (Signed)
[facility] ____________________________________________  Time seen: Approximately 12:06 PM  I have reviewed the triage vital signs and the nursing notes.   HISTORY  Chief Complaint Cough   HPI Kara Garza is a 73 y.o. female past medical history of recurrent bronchitis, COPD, hypertension presenting for evaluation of 1 week of runny nose, nasal congestion, sinus pressure, cough and intermittent wheezing.  States cough is her biggest complaint.  States intermittently feels sore from coughing.  Denies accompanying chest pain or shortness of breath.  States cough is productive of greenish mucus.  States believes she picked up a cold to start this from working at [employer].  Denies sore throat.  Denies known fevers.  Has continued to remain active.  Continues to overall eat and drink well.  Denies recent sickness or recent COPD exacerbation.  No recent prednisone use.  States no longer has albuterol inhalers at home to use if needed.  Reports otherwise doing well.  Dorothey Baseman, MD: PCP  Past Medical History:  Diagnosis Date  . Anemia    H/O  . Anxiety   . Asthma    PT DENIES BUT PCP HAS LISTED ON H&P  . Chronic kidney disease    H/O KIDNEY STONES  . Complication of anesthesia   . COPD (chronic obstructive pulmonary disease) (HCC)    PT DENIES BUT PCP HAS LISTED ON H&P  . Depression   . Hypertension   . PONV (postoperative nausea and vomiting)    NAUSEATED    There are no active problems to display for this patient.   Past Surgical History:  Procedure Laterality Date  . BREAST BIOPSY    . CYSTOSCOPY MACROPLASTIQUE IMPLANT N/A 04/12/2016   Procedure: CYSTOSCOPY MACROPLASTIQUE IMPLANT;  Surgeon: Orson Ape, MD;  Location: [facility] ORS;  Service: Urology;  Laterality: N/A;  . HAMMER TOE SURGERY    . HERNIA REPAIR    . TONSILLECTOMY    . TUBAL LIGATION       No current facility-administered medications for this encounter.   Current Outpatient  Medications:  .  gabapentin (NEURONTIN) 300 MG capsule, Take 2 to 3 capsule 3 times a day., Disp: , Rfl:  .  Secukinumab (COSENTYX [place]), Inject into the skin., Disp: , Rfl:  .  albuterol (PROVENTIL HFA;VENTOLIN HFA) 108 (90 Base) MCG/ACT inhaler, Inhale 2 puffs into the lungs every 4 (four) hours as needed for wheezing., Disp: 1 Inhaler, Rfl: 0 .  benzonatate (TESSALON PERLES) 100 MG capsule, Take 1 capsule (100 mg total) by mouth 3 (three) times daily as needed., Disp: 15 capsule, Rfl: 0 .  chlorpheniramine-HYDROcodone (TUSSIONEX PENNKINETIC ER) 10-8 MG/5ML SUER, Take 5 mLs by mouth at bedtime as needed for cough. do not drive or operate machinery while taking as can cause drowsiness., Disp: 30 mL, Rfl: 0 .  doxycycline (VIBRAMYCIN) 100 MG capsule, Take 1 capsule (100 mg total) by mouth 2 (two) times daily., Disp: 20 capsule, Rfl: 0 .  lisinopril-hydrochlorothiazide (PRINZIDE,ZESTORETIC) 20-12.5 MG tablet, Take 1 tablet by mouth daily., Disp: , Rfl:  .  Methen-Hyosc-Meth Blue-Na Phos (UROGESIC-BLUE) 81.6 MG TABS, Take 1 tablet (81.6 mg total) by mouth every 6 (six) hours as needed (dysuria)., Disp: 40 tablet, Rfl: 3 .  predniSONE (DELTASONE) 10 MG tablet, Start 60 mg po day one, then 50 mg po day two, taper by 10 mg daily until complete., Disp: 21 tablet, Rfl: 0 .  venlafaxine (EFFEXOR) 37.5 MG tablet, Take 37.5 mg by mouth every morning., Disp: ,  Rfl:   Allergies Ivp dye [iodinated diagnostic agents]  History reviewed. No pertinent family history.  Social History Social History   Tobacco Use  . Smoking status: Current Every Day Smoker    Packs/day: 1.00    Years: 25.00    Pack years: 25.00    Types: Cigarettes  . Smokeless tobacco: Never Used  Substance Use Topics  . Alcohol use: Yes    Comment: GLASS OF WINE AT BEDTIME  . Drug use: No    Review of Systems Constitutional: No fever ENT: No sore throat. As above.  Cardiovascular: Denies chest pain. Respiratory: Denies shortness of  breath. Gastrointestinal: No abdominal pain.   Musculoskeletal: Negative for back pain. Skin: Negative for rash.   ____________________________________________   PHYSICAL EXAM:  VITAL SIGNS: ED Triage Vitals  Enc Vitals Group     BP 01/02/19 1105 (!) 154/53     Pulse Rate 01/02/19 1105 94     Resp 01/02/19 1105 18     Temp 01/02/19 1105 98.8 F (37.1 C)     Temp Source 01/02/19 1105 Oral     SpO2 01/02/19 1105 95 %     Weight 01/02/19 1108 165 lb (74.8 kg)     Height 01/02/19 1108 5\' 5"  (1.651 m)     Head Circumference --      Peak Flow --      Pain Score 01/02/19 1108 3     Pain Loc --      Pain Edu? --      Excl. in GC? --     Constitutional: Alert and oriented. Well appearing and in no acute distress. Eyes: Conjunctivae are normal.  Head: Atraumatic. No sinus tenderness to palpation. No swelling. No erythema.  Ears: no erythema, normal TMs bilaterally.   Nose:Nasal congestion  Mouth/Throat: Mucous membranes are moist. No pharyngeal erythema. No tonsillar swelling or exudate.  Neck: No stridor.  No cervical spine tenderness to palpation. Hematological/Lymphatic/Immunilogical: No cervical lymphadenopathy. Cardiovascular: Normal rate, regular rhythm. Grossly normal heart sounds.  Good peripheral circulation. Respiratory: Normal respiratory effort.  No retractions.  Mild scattered rhonchi.  Minimal scattered expiratory wheeze.  Good air movement.  Dry intermittent cough with bronchospasm.  Speaks in complete sentences. Musculoskeletal: Ambulatory with steady gait.  No lower extremity edema noted. Neurologic:  Normal speech and language. No gait instability. Skin:  Skin appears warm, dry and intact. No rash noted. Psychiatric: Mood and affect are normal. Speech and behavior are normal. ___________________________________________   LABS (all labs ordered are listed, but only abnormal results are displayed)  Labs Reviewed - No data to display   PROCEDURES Procedures      INITIAL IMPRESSION / ASSESSMENT AND PLAN / ED COURSE  Pertinent labs & imaging results that were available during my care of the patient were reviewed by me and considered in my medical decision making (see chart for details).  Well-appearing patient.  No acute distress.  Suspect recent viral upper respiratory infection with COPD exacerbation.  Will treat with prednisone taper, PRN albuterol inhaler, doxycycline, PRN Tessalon Perles and PRN Tussionex.  Encourage rest, fluids, supportive care.Discussed indication, risks and benefits of medications with patient.  Discussed follow up with Primary care physician this week. Discussed follow up and return parameters including no resolution or any worsening concerns. Patient verbalized understanding and agreed to plan.   ____________________________________________   FINAL CLINICAL IMPRESSION(S) / ED DIAGNOSES  Final diagnoses:  COPD exacerbation (HCC)  Viral URI with cough     ED  Discharge Orders         Ordered    doxycycline (VIBRAMYCIN) 100 MG capsule  2 times daily     01/02/19 1141    predniSONE (DELTASONE) 10 MG tablet     01/02/19 1141    chlorpheniramine-HYDROcodone (TUSSIONEX PENNKINETIC ER) 10-8 MG/5ML SUER  At bedtime PRN     01/02/19 1141    benzonatate (TESSALON PERLES) 100 MG capsule  3 times daily PRN     01/02/19 1141    albuterol (PROVENTIL HFA;VENTOLIN HFA) 108 (90 Base) MCG/ACT inhaler  Every 4 hours PRN     01/02/19 1141           Note: This dictation was prepared with Dragon dictation along with smaller phrase technology. Any transcriptional errors that result from this process are unintentional.         Renford Dills, NP 01/02/19 1209

## 2020-01-09 ENCOUNTER — Other Ambulatory Visit: Payer: Self-pay | Admitting: Urology

## 2020-01-09 DIAGNOSIS — N200 Calculus of kidney: Secondary | ICD-10-CM

## 2020-01-09 DIAGNOSIS — N390 Urinary tract infection, site not specified: Secondary | ICD-10-CM

## 2020-01-21 ENCOUNTER — Ambulatory Visit: Admission: RE | Admit: 2020-01-21 | Payer: Medicare Other | Source: Ambulatory Visit

## 2020-01-22 ENCOUNTER — Other Ambulatory Visit: Payer: Self-pay

## 2020-01-22 ENCOUNTER — Ambulatory Visit
Admission: RE | Admit: 2020-01-22 | Discharge: 2020-01-22 | Disposition: A | Discharge status: Home / Self Care | Payer: Medicare Other | Source: Ambulatory Visit | Attending: Urology | Admitting: Urology

## 2020-01-22 DIAGNOSIS — N390 Urinary tract infection, site not specified: Secondary | ICD-10-CM | POA: Insufficient documentation

## 2020-01-22 DIAGNOSIS — N200 Calculus of kidney: Secondary | ICD-10-CM | POA: Insufficient documentation

## 2020-02-06 ENCOUNTER — Encounter: Payer: Self-pay | Admitting: Emergency Medicine

## 2020-02-06 ENCOUNTER — Emergency Department: Payer: Medicare Other

## 2020-02-06 ENCOUNTER — Other Ambulatory Visit: Payer: Self-pay

## 2020-02-06 ENCOUNTER — Emergency Department
Admission: EM | Admit: 2020-02-06 | Discharge: 2020-02-06 | Disposition: A | Discharge status: Home / Self Care | Payer: Medicare Other | Attending: Emergency Medicine | Admitting: Emergency Medicine

## 2020-02-06 DIAGNOSIS — E860 Dehydration: Secondary | ICD-10-CM | POA: Insufficient documentation

## 2020-02-06 DIAGNOSIS — Z20822 Contact with and (suspected) exposure to covid-19: Secondary | ICD-10-CM | POA: Insufficient documentation

## 2020-02-06 DIAGNOSIS — R531 Weakness: Secondary | ICD-10-CM

## 2020-02-06 DIAGNOSIS — J449 Chronic obstructive pulmonary disease, unspecified: Secondary | ICD-10-CM | POA: Diagnosis not present

## 2020-02-06 DIAGNOSIS — F17210 Nicotine dependence, cigarettes, uncomplicated: Secondary | ICD-10-CM | POA: Diagnosis not present

## 2020-02-06 DIAGNOSIS — N189 Chronic kidney disease, unspecified: Secondary | ICD-10-CM | POA: Diagnosis not present

## 2020-02-06 DIAGNOSIS — I129 Hypertensive chronic kidney disease with stage 1 through stage 4 chronic kidney disease, or unspecified chronic kidney disease: Secondary | ICD-10-CM | POA: Insufficient documentation

## 2020-02-06 DIAGNOSIS — J45909 Unspecified asthma, uncomplicated: Secondary | ICD-10-CM | POA: Diagnosis not present

## 2020-02-06 LAB — URINALYSIS, COMPLETE (UACMP) WITH MICROSCOPIC
Bacteria, UA: NONE SEEN
Bilirubin Urine: NEGATIVE
Glucose, UA: NEGATIVE mg/dL
Hgb urine dipstick: NEGATIVE
Ketones, ur: NEGATIVE mg/dL
Nitrite: NEGATIVE
Protein, ur: NEGATIVE mg/dL
Specific Gravity, Urine: 1.02 (ref 1.005–1.030)
pH: 5 (ref 5.0–8.0)

## 2020-02-06 LAB — CBC
HCT: 43.4 % (ref 36.0–46.0)
Hemoglobin: 14.1 g/dL (ref 12.0–15.0)
MCH: 30.9 pg (ref 26.0–34.0)
MCHC: 32.5 g/dL (ref 30.0–36.0)
MCV: 95 fL (ref 80.0–100.0)
Platelets: 333 10*3/uL (ref 150–400)
RBC: 4.57 MIL/uL (ref 3.87–5.11)
RDW: 13.1 % (ref 11.5–15.5)
WBC: 10.6 10*3/uL — ABNORMAL HIGH (ref 4.0–10.5)
nRBC: 0 % (ref 0.0–0.2)

## 2020-02-06 LAB — BASIC METABOLIC PANEL
Anion gap: 8 (ref 5–15)
Anion gap: 5 (ref 5–15)
BUN: 40 mg/dL — ABNORMAL HIGH (ref 8–23)
BUN: 38 mg/dL — ABNORMAL HIGH (ref 8–23)
CO2: 26 mmol/L (ref 22–32)
CO2: 23 mmol/L (ref 22–32)
Calcium: 9.8 mg/dL (ref 8.9–10.3)
Calcium: 8.7 mg/dL — ABNORMAL LOW (ref 8.9–10.3)
Chloride: 103 mmol/L (ref 98–111)
Chloride: 111 mmol/L (ref 98–111)
Creatinine, Ser: 1.97 mg/dL — ABNORMAL HIGH (ref 0.44–1.00)
Creatinine, Ser: 1.45 mg/dL — ABNORMAL HIGH (ref 0.44–1.00)
GFR calc Af Amer: 29 mL/min — ABNORMAL LOW (ref >60)
GFR calc Af Amer: 42 mL/min — ABNORMAL LOW (ref >60)
GFR calc non Af Amer: 25 mL/min — ABNORMAL LOW (ref >60)
GFR calc non Af Amer: 36 mL/min — ABNORMAL LOW (ref >60)
Glucose, Bld: 120 mg/dL — ABNORMAL HIGH (ref 70–99)
Glucose, Bld: 118 mg/dL — ABNORMAL HIGH (ref 70–99)
Potassium: 4.6 mmol/L (ref 3.5–5.1)
Potassium: 4 mmol/L (ref 3.5–5.1)
Sodium: 137 mmol/L (ref 135–145)
Sodium: 139 mmol/L (ref 135–145)

## 2020-02-06 LAB — RESPIRATORY PANEL BY RT PCR (FLU A&B, COVID)
Influenza A by PCR: NEGATIVE
Influenza B by PCR: NEGATIVE
SARS Coronavirus 2 by RT PCR: NEGATIVE

## 2020-02-06 LAB — TROPONIN I (HIGH SENSITIVITY): Troponin I (High Sensitivity): 7 ng/L (ref <18)

## 2020-02-06 LAB — GLUCOSE, CAPILLARY: Glucose-Capillary: 115 mg/dL — ABNORMAL HIGH (ref 70–99)

## 2020-02-06 MED ORDER — ACETAMINOPHEN 500 MG PO TABS
1000.0000 mg | ORAL_TABLET | Freq: Once | ORAL | Status: AC
  Administered 2020-02-06: 1000 mg via ORAL
  Filled 2020-02-06: qty 2

## 2020-02-06 MED ORDER — SODIUM CHLORIDE 0.9 % IV SOLN: Freq: Once | INTRAVENOUS | Status: AC

## 2020-02-06 NOTE — ED Triage Notes (Signed)
Pt brought via EMS. Patient complaining of generalized weakness for 2-3 days. States she is also having cramping in hands and neck.   Pt reports getting COVID vaccine yesterday, however is not having any new symptoms since then.

## 2020-02-06 NOTE — ED Provider Notes (Signed)
[facility] Emergency Department Provider Note       Time seen: ----------------------------------------- 3:45 PM on 02/06/2020 -----------------------------------------   I have reviewed the triage vital signs and the nursing notes.  HISTORY   Chief Complaint Weakness    HPI Kara SUNDEEN is a 74 y.o. female with a history of anemia, anxiety, COPD, hypertension, depression who presents to the ED for generalized weakness for the past 2 to 3 days.  Patient is having cramping in her hands and neck.  Received a Covid vaccine yesterday, however was having symptoms before that.  Discomfort is 5 out of 10.  She has been having some diarrhea but otherwise denies any complaints.  Past Medical History:  Diagnosis Date  . Anemia    H/O  . Anxiety   . Asthma    PT DENIES BUT PCP HAS LISTED ON H&P  . Chronic kidney disease    H/O KIDNEY STONES  . Complication of anesthesia   . COPD (chronic obstructive pulmonary disease) (HCC)    PT DENIES BUT PCP HAS LISTED ON H&P  . Depression   . Hypertension   . PONV (postoperative nausea and vomiting)    NAUSEATED    There are no problems to display for this patient.   Past Surgical History:  Procedure Laterality Date  . BREAST BIOPSY    . CYSTOSCOPY MACROPLASTIQUE IMPLANT N/A 04/12/2016   Procedure: CYSTOSCOPY MACROPLASTIQUE IMPLANT;  Surgeon: Orson Ape, MD;  Location: [facility] ORS;  Service: Urology;  Laterality: N/A;  . HAMMER TOE SURGERY    . HERNIA REPAIR    . TONSILLECTOMY    . TUBAL LIGATION      Allergies Ivp dye [iodinated diagnostic agents]  Social History Social History   Tobacco Use  . Smoking status: Current Every Day Smoker    Packs/day: 1.00    Years: 25.00    Pack years: 25.00    Types: Cigarettes  . Smokeless tobacco: Never Used  Substance Use Topics  . Alcohol use: Yes    Comment: GLASS OF WINE AT BEDTIME  . Drug use: No    Review of Systems Constitutional: Negative for  fever. Cardiovascular: Negative for chest pain. Respiratory: Negative for shortness of breath. Gastrointestinal: Negative for abdominal pain, positive for diarrhea Musculoskeletal: Positive for neck pain Skin: Negative for rash. Neurological: Negative for headaches, positive for weakness  All systems negative/normal/unremarkable except as stated in the HPI  ____________________________________________   PHYSICAL EXAM:  VITAL SIGNS: ED Triage Vitals  Enc Vitals Group     BP 02/06/20 1503 (!) 128/43     Pulse Rate 02/06/20 1503 93     Resp 02/06/20 1503 16     Temp 02/06/20 1503 97.8 F (36.6 C)     Temp Source 02/06/20 1503 Oral     SpO2 02/06/20 1503 95 %     Weight 02/06/20 1504 160 lb (72.6 kg)     Height 02/06/20 1504 5\' 5"  (1.651 m)     Head Circumference --      Peak Flow --      Pain Score 02/06/20 1504 5     Pain Loc --      Pain Edu? --      Excl. in GC? --     Constitutional: Alert and oriented.  No acute distress Eyes: Conjunctivae are normal. Normal extraocular movements. ENT      Head: Normocephalic and atraumatic.      Nose: No congestion/rhinnorhea.  Mouth/Throat: Mucous membranes are moist.      Neck: No stridor. Cardiovascular: Normal rate, regular rhythm. No murmurs, rubs, or gallops. Respiratory: Normal respiratory effort without tachypnea nor retractions. Breath sounds are clear and equal bilaterally. No wheezes/rales/rhonchi. Gastrointestinal: Soft and nontender. Normal bowel sounds Musculoskeletal: Nontender with normal range of motion in extremities. No lower extremity tenderness nor edema. Neurologic:  Normal speech and language. No gross focal neurologic deficits are appreciated.  Skin:  Skin is warm, dry and intact. No rash noted. Psychiatric: Mood and affect are normal. Speech and behavior are normal.  ____________________________________________  EKG: Interpreted by me.  Sinus rhythm with rate of 93 bpm, normal PR interval, normal QRS,  normal QT  ____________________________________________  ED COURSE:  As part of my medical decision making, I reviewed the following data within the [place] History obtained from family if available, nursing notes, old chart and ekg, as well as notes from prior ED visits. Patient presented for weakness, we will assess with labs and imaging as indicated at this time. Clinical Course as of Feb 05 1922  Thu Feb 06, 2020  [place] Patient states she is feeling better after IV fluids.   [JW]    Clinical Course User Index [JW] Earleen Newport, MD   Procedures  Greyson Riccardi Aylward was evaluated in Emergency Department on 02/06/2020 for the symptoms described in the history of present illness. She was evaluated in the context of the global COVID-19 pandemic, which necessitated consideration that the patient might be at risk for infection with the SARS-CoV-2 virus that causes COVID-19. Institutional protocols and algorithms that pertain to the evaluation of patients at risk for COVID-19 are in a state of rapid change based on information released by regulatory bodies including the CDC and federal and state organizations. These policies and algorithms were followed during the patient's care in the ED.  ____________________________________________   LABS (pertinent positives/negatives)  Labs Reviewed  BASIC METABOLIC PANEL - Abnormal; Notable for the following components:      Result Value   Glucose, Bld 120 (*)    BUN 40 (*)    Creatinine, Ser 1.97 (*)    GFR calc non Af Amer 25 (*)    GFR calc Af Amer 29 (*)    All other components within normal limits  CBC - Abnormal; Notable for the following components:   WBC 10.6 (*)    All other components within normal limits  URINALYSIS, COMPLETE (UACMP) WITH MICROSCOPIC - Abnormal; Notable for the following components:   Color, Urine YELLOW (*)    APPearance HAZY (*)    Leukocytes,Ua SMALL (*)    All other components within normal  limits  GLUCOSE, CAPILLARY - Abnormal; Notable for the following components:   Glucose-Capillary 115 (*)    All other components within normal limits  BASIC METABOLIC PANEL - Abnormal; Notable for the following components:   Glucose, Bld 118 (*)    BUN 38 (*)    Creatinine, Ser 1.45 (*)    Calcium 8.7 (*)    GFR calc non Af Amer 36 (*)    GFR calc Af Amer 42 (*)    All other components within normal limits  RESPIRATORY PANEL BY RT PCR (FLU A&B, COVID)  URINE CULTURE  CBG MONITORING, ED  TROPONIN I (HIGH SENSITIVITY)   ____________________________________________   DIFFERENTIAL DIAGNOSIS   Dehydration, electrolyte abnormality, AKI, colitis, arrhythmia  FINAL ASSESSMENT AND PLAN  Weakness, dehydration   Plan: The patient had  presented for weakness likely secondary to dehydration as reflected in her AKI. Patient's labs revealed an elevated BUN and creatinine for which she was given IV fluids.  Repeat labs looked better, patient was certainly feeling better after the fluid she received.  She is advised to have close outpatient follow-up with her doctor.   Ulice Dash, MD    Note: This note was generated in part or whole with voice recognition software. Voice recognition is usually quite accurate but there are transcription errors that can and very often do occur. I apologize for any typographical errors that were not detected and corrected.     Emily Filbert, MD 02/06/20 1924

## 2020-02-07 LAB — URINE CULTURE: Culture: NO GROWTH

## 2020-04-27 DIAGNOSIS — Z720 Tobacco use: Secondary | ICD-10-CM | POA: Insufficient documentation

## 2020-04-27 DIAGNOSIS — F17200 Nicotine dependence, unspecified, uncomplicated: Secondary | ICD-10-CM | POA: Insufficient documentation

## 2020-04-28 ENCOUNTER — Other Ambulatory Visit: Payer: Self-pay | Admitting: Family Medicine

## 2020-04-28 DIAGNOSIS — Z1231 Encounter for screening mammogram for malignant neoplasm of breast: Secondary | ICD-10-CM

## 2020-05-05 ENCOUNTER — Ambulatory Visit
Admission: RE | Admit: 2020-05-05 | Discharge: 2020-05-05 | Disposition: A | Discharge status: Home / Self Care | Payer: Medicare Other | Source: Ambulatory Visit | Attending: Family Medicine | Admitting: Family Medicine

## 2020-05-05 DIAGNOSIS — Z1231 Encounter for screening mammogram for malignant neoplasm of breast: Secondary | ICD-10-CM | POA: Diagnosis present

## 2020-05-12 ENCOUNTER — Other Ambulatory Visit: Payer: Self-pay | Admitting: *Deleted

## 2020-05-12 ENCOUNTER — Inpatient Hospital Stay
Admission: RE | Admit: 2020-05-12 | Discharge: 2020-05-12 | Disposition: A | Discharge status: Home / Self Care | Payer: Self-pay | Source: Ambulatory Visit | Attending: *Deleted | Admitting: *Deleted

## 2020-05-12 DIAGNOSIS — Z1231 Encounter for screening mammogram for malignant neoplasm of breast: Secondary | ICD-10-CM

## 2020-10-30 ENCOUNTER — Encounter: Payer: Self-pay | Admitting: Urology

## 2020-10-30 ENCOUNTER — Other Ambulatory Visit: Payer: Self-pay

## 2020-10-30 ENCOUNTER — Ambulatory Visit (INDEPENDENT_AMBULATORY_CARE_PROVIDER_SITE_OTHER): Payer: Medicare Other | Admitting: Urology

## 2020-10-30 VITALS — BP 135/71 | HR 86 | Ht 65.0 in | Wt 159.0 lb

## 2020-10-30 DIAGNOSIS — N200 Calculus of kidney: Secondary | ICD-10-CM | POA: Diagnosis not present

## 2020-10-30 DIAGNOSIS — N390 Urinary tract infection, site not specified: Secondary | ICD-10-CM | POA: Diagnosis not present

## 2020-10-30 LAB — URINALYSIS, COMPLETE
Bilirubin, UA: NEGATIVE
Glucose, UA: NEGATIVE
Ketones, UA: NEGATIVE
Nitrite, UA: POSITIVE — AB
Specific Gravity, UA: 1.025 (ref 1.005–1.030)
Urobilinogen, Ur: 0.2 mg/dL (ref 0.2–1.0)
pH, UA: 6 (ref 5.0–7.5)

## 2020-10-30 LAB — MICROSCOPIC EXAMINATION
Bacteria, UA: NONE SEEN
WBC, UA: >30 /hpf — AB (ref 0–5)

## 2020-10-30 MED ORDER — OXYCODONE-ACETAMINOPHEN 5-325 MG PO TABS: 1.0000 | ORAL_TABLET | Freq: Four times a day (QID) | ORAL | 0 refills | Status: DC | PRN

## 2020-10-30 MED ORDER — HYOSCYAMINE SULFATE SL 0.125 MG SL SUBL: SUBLINGUAL_TABLET | SUBLINGUAL | 0 refills | Status: DC

## 2020-10-30 NOTE — Progress Notes (Signed)
10/30/2020 12:02 PM   Leanette Eutsler Marchetti 16-May-1946 [id number]  Referring provider: Cyndia Diver, PA-C [address] [place],  [place] [address]  Chief Complaint  Patient presents with  . Recurrent UTI    HPI: 74 y.o. female presents for evaluation of persistent UTI.   Previously followed by Dr. Evelene Croon for history of stone disease and recurrent UTI  Saw PCP 10/21/2020 complaining of frequency, urgency and dysuria x6 days  Urinalysis with pyuria, urine culture and started on nitrofurantoin  No systemic symptoms  Urine culture positive Klebsiella and E. coli with Klebsiella intermediate to nitrofurantoin  Return to PCP 10/26/2020 with worsening symptoms  Urinalysis with persistent pyuria   Antibiotic switched to Cipro  Presented to [facility] ED 11/9 with no improvement in symptoms  Denied fever or chills  Urinalysis with persistent pyuria and urine culture negative  Received IV Rocephin and discharged home  Return to [facility] ED on 10/28/2020 with complaints of severe urethral pain, frequency and urgency  Was admitted however stayed in ED due to bed shortage  CT abdomen pelvis with bilateral nonobstructing renal calculi, renal cysts and unremarkable bladder  Discharged 10/29/2020  Most bothersome symptom is urethral pain, dribbling and intense urinary frequency, urgency; CT showed no evidence urinary retention   PMH: Past Medical History:  Diagnosis Date  . Anemia    H/O  . Anxiety   . Asthma    PT DENIES BUT PCP HAS LISTED ON H&P  . Chronic kidney disease    H/O KIDNEY STONES  . Complication of anesthesia   . COPD (chronic obstructive pulmonary disease) (HCC)    PT DENIES BUT PCP HAS LISTED ON H&P  . Depression   . Hypertension   . PONV (postoperative nausea and vomiting)    NAUSEATED    Surgical History: Past Surgical History:  Procedure Laterality Date  . BREAST BIOPSY Right yrs ago   benign  . CYSTOSCOPY MACROPLASTIQUE IMPLANT N/A  04/12/2016   Procedure: CYSTOSCOPY MACROPLASTIQUE IMPLANT;  Surgeon: Orson Ape, MD;  Location: [facility] ORS;  Service: Urology;  Laterality: N/A;  . HAMMER TOE SURGERY    . HERNIA REPAIR    . TONSILLECTOMY    . TUBAL LIGATION      Home Medications:  Allergies as of 10/30/2020      Reactions   Amoxicillin Diarrhea   Amoxicillin-pot Clavulanate Other (See Comments)   Causes GI issues   Ivp Dye [iodinated Diagnostic Agents] Rash      Medication List       Accurate as of October 30, 2020 12:02 PM. If you have any questions, ask your nurse or doctor.        COSENTYX [place] Inject into the skin.   gabapentin 300 MG capsule Commonly known as: NEURONTIN Take 600-900 mg by mouth 3 (three) times daily.   lisinopril-hydrochlorothiazide 20-12.5 MG tablet Commonly known as: ZESTORETIC Take 1 tablet by mouth daily.   oxybutynin 5 MG tablet Commonly known as: DITROPAN Take 5 mg by mouth every 8 (eight) hours as needed for bladder spasms.   sulfamethoxazole-trimethoprim 800-160 MG tablet Commonly known as: BACTRIM DS Take 1 tablet by mouth 2 (two) times daily. For 7 days   venlafaxine XR 37.5 MG 24 hr capsule Commonly known as: EFFEXOR-XR Take 37.5 mg by mouth daily.       Allergies:  Allergies  Allergen Reactions  . Amoxicillin Diarrhea  . Amoxicillin-Pot Clavulanate Other (See Comments)    Causes GI issues  . Ivp  Dye [Iodinated Diagnostic Agents] Rash    Family History: Family History  Problem Relation Age of Onset  . Breast cancer Mother 46    Social History:  reports that she has been smoking cigarettes. She has a 25.00 pack-year smoking history. She has never used smokeless tobacco. She reports current alcohol use. She reports that she does not use drugs.   Physical Exam: There were no vitals taken for this visit.  Constitutional:  Alert and oriented, No acute distress. HEENT: [place] AT, moist mucus membranes.  Trachea midline, no masses. Cardiovascular: No  clubbing, cyanosis, or edema. Respiratory: Normal respiratory effort, no increased work of breathing. Skin: No rashes, bruises or suspicious lesions. Neurologic: Grossly intact, no focal deficits, moving all 4 extremities. Psychiatric: Normal mood and affect.  Laboratory Data:  Urinalysis Dipstick 1+ blood 2+ protein nitrite positive trace leukocyte Microscopy >30 WBC, 3-10 RBC  Assessment & Plan:    1.  Complicated UTI  Persistent symptoms pyuria though has only been on Cipro for 3 full days  Repeat urine culture today  Has tried phenazopyridine/Uribel without improvement  Minimal improvement on oxybutynin  Given Myrbetriq samples 50 mg daily  Rx hyoscyamine sublingually as needed for bladder spasms  Limited Rx Percocet sent to pharmacy for pain  Will contact patient with culture results and follow-up clinical response at that time  2.  Nephrolithiasis  Bilateral, nonobstructing renal calculi  I spent 45 total minutes on the day of the encounter including pre-visit review of the medical record, face-to-face time with the patient, and post visit ordering of labs/imaging/tests.    Riki Altes, MD  [facility] [address] [place], [place] [address] [phone]

## 2020-11-03 ENCOUNTER — Telehealth: Payer: Self-pay | Admitting: *Deleted

## 2020-11-03 LAB — CULTURE, URINE COMPREHENSIVE

## 2020-11-03 MED ORDER — CIPROFLOXACIN HCL 250 MG PO TABS: 250.0000 mg | ORAL_TABLET | Freq: Two times a day (BID) | ORAL | 0 refills | Status: AC

## 2020-11-03 NOTE — Telephone Encounter (Signed)
Pt calling asking for more Cipro, she states she's starting to feel a little better but doesn't want to run out of meds and have her UTI return. Pt states she's still having a little burning with urination. Please advise

## 2020-11-03 NOTE — Telephone Encounter (Signed)
Rx sent 

## 2020-11-17 ENCOUNTER — Telehealth: Payer: Self-pay

## 2020-11-17 NOTE — Telephone Encounter (Signed)
Pt LM on triage line stating she believes she has a uti. Pt c/o dysuria, and urinary frequency.   Returned pt's call, she reiterated symptoms above. Pt added to scheduled for next available. PT gave verbal understanding.

## 2020-11-18 ENCOUNTER — Encounter: Payer: Self-pay | Admitting: Physician Assistant

## 2020-11-18 ENCOUNTER — Other Ambulatory Visit: Payer: Self-pay

## 2020-11-18 ENCOUNTER — Ambulatory Visit (INDEPENDENT_AMBULATORY_CARE_PROVIDER_SITE_OTHER): Payer: Medicare Other | Admitting: Physician Assistant

## 2020-11-18 VITALS — BP 117/73 | HR 91 | Temp 98.2°F | Ht 65.0 in

## 2020-11-18 DIAGNOSIS — R350 Frequency of micturition: Secondary | ICD-10-CM

## 2020-11-18 MED ORDER — MIRABEGRON ER 50 MG PO TB24: 50.0000 mg | ORAL_TABLET | Freq: Every day | ORAL | 5 refills | Status: DC

## 2020-11-18 MED ORDER — HYOSCYAMINE SULFATE SL 0.125 MG SL SUBL: SUBLINGUAL_TABLET | SUBLINGUAL | 2 refills | Status: DC

## 2020-11-18 NOTE — Progress Notes (Signed)
11/18/2020 2:03 PM   Kara Garza February 26, 1946 [id number]  CC: Chief Complaint  Patient presents with  . Urinary Tract Infection    HPI: Kara Garza is a 74 y.o. female with a history of stress incontinence s/p Macroplastique injection with Dr. Evelene Garza in 2017, now with urethral pain and spasms, urgency, and frequency with possible recurrent versus persistent UTI who presents today for evaluation of possible UTI.   Today she reports continued symptoms of intense burning and pain with urination.  She has urinary frequency every hour, urgency, and rare urge incontinence (1-2x/week).  She describes painful urethral spasms.  She reports frustration and significant bother from her symptoms.  Overall, she states her symptoms began approximately 1 year ago.  They never resolve entirely, however she has had multiple "flares" during this time.  She states in the past she is felt that antibiotics has knocked out these flares.  She has had 4 positive urine cultures over the past year with E. coli and Klebsiella species.  She previously saw Kara Garza and was started on Myrbetriq and hyoscyamine.  She states both of these helped her symptoms, especially the hyoscyamine.  She has a history of chronic low back pain without spinal surgery.  In-office UA today positive for trace ketones and 1+ protein; urine microscopy with 6-10 WBCs/HPF.  PMH: Past Medical History:  Diagnosis Date  . Anemia    H/O  . Anxiety   . Asthma    PT DENIES BUT PCP HAS LISTED ON H&P  . Chronic kidney disease    H/O KIDNEY STONES  . Complication of anesthesia   . COPD (chronic obstructive pulmonary disease) (HCC)    PT DENIES BUT PCP HAS LISTED ON H&P  . Depression   . Hypertension   . PONV (postoperative nausea and vomiting)    NAUSEATED    Surgical History: Past Surgical History:  Procedure Laterality Date  . BREAST BIOPSY Right yrs ago   benign  . CYSTOSCOPY MACROPLASTIQUE IMPLANT N/A 04/12/2016    Procedure: CYSTOSCOPY MACROPLASTIQUE IMPLANT;  Surgeon: Kara Ape, MD;  Location: [facility] ORS;  Service: Urology;  Laterality: N/A;  . HAMMER TOE SURGERY    . HERNIA REPAIR    . TONSILLECTOMY    . TUBAL LIGATION      Home Medications:  Allergies as of 11/18/2020      Reactions   Amoxicillin Diarrhea   Amoxicillin-pot Clavulanate Other (See Comments)   Causes GI issues   Ivp Dye [iodinated Diagnostic Agents] Rash      Medication List       Accurate as of November 18, 2020  2:03 PM. If you have any questions, ask your nurse or doctor.        STOP taking these medications   oxybutynin 5 MG tablet Commonly known as: DITROPAN Stopped by: Carman Ching, PA-C     TAKE these medications   Cholecalciferol 25 MCG (1000 UT) tablet Take by mouth.   COSENTYX [place] Inject into the skin.   Hyoscyamine Sulfate SL 0.125 MG Subl Commonly known as: Levsin/SL 1-2 tabs dissolved under tongue every 6 hours as needed for frequency, urgency   lisinopril-hydrochlorothiazide 20-12.5 MG tablet Commonly known as: ZESTORETIC Take 1 tablet by mouth daily.   mirabegron ER 50 MG Tb24 tablet Commonly known as: MYRBETRIQ Take 1 tablet (50 mg total) by mouth daily. Started by: Carman Ching, PA-C   oxyCODONE-acetaminophen 5-325 MG tablet Commonly known as: PERCOCET/ROXICET Take 1 tablet by mouth every  6 (six) hours as needed for severe pain.   venlafaxine XR 37.5 MG 24 hr capsule Commonly known as: EFFEXOR-XR Take 37.5 mg by mouth daily.       Allergies:  Allergies  Allergen Reactions  . Amoxicillin Diarrhea  . Amoxicillin-Pot Clavulanate Other (See Comments)    Causes GI issues  . Ivp Dye [Iodinated Diagnostic Agents] Rash    Family History: Family History  Problem Relation Age of Onset  . Breast cancer Mother 76    Social History:   reports that she has been smoking cigarettes. She has a 25.00 pack-year smoking history. She has never used smokeless tobacco.  She reports current alcohol use. She reports that she does not use drugs.  Physical Exam: BP 117/73   Pulse 91   Temp 98.2 F (36.8 C)   Ht 5\' 5"  (1.651 m)   BMI 26.46 kg/m   Constitutional:  Alert and oriented, no acute distress, nontoxic appearing HEENT: Kara Garza, AT Cardiovascular: No clubbing, cyanosis, or edema Respiratory: Normal respiratory effort, no increased work of breathing Skin: No rashes, bruises or suspicious lesions Neurologic: Grossly intact, no focal deficits, moving all 4 extremities Psychiatric: Normal mood and affect  Laboratory Data: Results for orders placed or performed in visit on 11/18/20  CULTURE, URINE COMPREHENSIVE   Specimen: Urine   UR  Result Value Ref Range   Urine Culture, Comprehensive Final report    Organism ID, Bacteria Comment   Microscopic Examination   Urine  Result Value Ref Range   WBC, UA 6-10 (A) 0 - 5 /hpf   RBC 0-2 0 - 2 /hpf   Epithelial Cells (non renal) 0-10 0 - 10 /hpf   Casts Present (A) None seen /lpf   Cast Type Hyaline casts N/A   Bacteria, UA Few None seen/Few  Urinalysis, Complete  Result Value Ref Range   Specific Gravity, UA >1.030 (H) 1.005 - 1.030   pH, UA 5.5 5.0 - 7.5   Color, UA Yellow Yellow   Appearance Ur Cloudy (A) Clear   Leukocytes,UA Negative Negative   Protein,UA 1+ (A) Negative/Trace   Glucose, UA Negative Negative   Ketones, UA Trace (A) Negative   RBC, UA Negative Negative   Bilirubin, UA Negative Negative   Urobilinogen, Ur 0.2 0.2 - 1.0 mg/dL   Nitrite, UA Negative Negative   Microscopic Examination See below:    Assessment & Plan:   1. Urinary frequency 74 year old female s/p Macroplastique injection in 2017, now with an approximate 1 year history of waxing and waning bothersome urinary symptoms including frequency, urgency, UUI, dysuria, and urethral spasms.  At this point, I strongly suspect OAB with recurrent UTI, however cannot rule out Macroplastique erosion without further  evaluation.  UA relatively benign today, will send for culture for further evaluation and defer antibiotics pending results.  Will refill Myrbetriq and hyoscyamine for symptomatic control in the interim.  Ultimately, I would like her to undergo cystoscopy with Dr. 2018 for evaluation of possible Macroplastique erosion. - Urinalysis, Complete - CULTURE, URINE COMPREHENSIVE - mirabegron ER (MYRBETRIQ) 50 MG TB24 tablet; Take 1 tablet (50 mg total) by mouth daily.  Dispense: 30 tablet; Refill: 5 - Hyoscyamine Sulfate SL (LEVSIN/SL) 0.125 MG SUBL; 1-2 tabs dissolved under tongue every 6 hours as needed for frequency, urgency  Dispense: 60 tablet; Refill: 2  Return in about 2 weeks (around 12/02/2020) for Cystoscopy with Dr. 12/04/2020.  Sherron Monday, PA-C  [facility] [address], [address] [place], [place]  [address] [phone]    

## 2020-11-18 NOTE — Patient Instructions (Addendum)
RESTART Myrbetriq 50mg  daily. Take hyoscyamine as needed for breakthrough pain. I will send your urine for culture today and call you with the results.

## 2020-11-20 LAB — URINALYSIS, COMPLETE
Bilirubin, UA: NEGATIVE
Glucose, UA: NEGATIVE
Leukocytes,UA: NEGATIVE
Nitrite, UA: NEGATIVE
RBC, UA: NEGATIVE
Specific Gravity, UA: >1.03 — ABNORMAL HIGH (ref 1.005–1.030)
Urobilinogen, Ur: 0.2 mg/dL (ref 0.2–1.0)
pH, UA: 5.5 (ref 5.0–7.5)

## 2020-11-20 LAB — MICROSCOPIC EXAMINATION

## 2020-11-22 LAB — CULTURE, URINE COMPREHENSIVE

## 2020-12-07 ENCOUNTER — Encounter: Payer: Self-pay | Admitting: Urology

## 2020-12-07 ENCOUNTER — Other Ambulatory Visit: Payer: Self-pay

## 2020-12-07 ENCOUNTER — Ambulatory Visit (INDEPENDENT_AMBULATORY_CARE_PROVIDER_SITE_OTHER): Payer: Medicare Other | Admitting: Urology

## 2020-12-07 VITALS — BP 151/81 | HR 93

## 2020-12-07 DIAGNOSIS — R350 Frequency of micturition: Secondary | ICD-10-CM | POA: Diagnosis not present

## 2020-12-07 DIAGNOSIS — N3020 Other chronic cystitis without hematuria: Secondary | ICD-10-CM

## 2020-12-07 MED ORDER — NITROFURANTOIN MACROCRYSTAL 100 MG PO CAPS: 100.0000 mg | ORAL_CAPSULE | Freq: Every day | ORAL | 11 refills | Status: DC

## 2020-12-07 NOTE — Progress Notes (Signed)
12/07/2020 3:32 PM   Kara Garza 1946-01-08 [id number]  Referring provider: Dorothey Baseman, MD [phone] [address] [place],  [place] [address]  No chief complaint on file.   HPI: Extender note: Kara Garza is a 74 y.o. female with a history of stress incontinence s/p Macroplastique injection with Dr. Evelene Croon in 2017, now with urethral pain and spasms, urgency, and frequency with possible recurrent versus persistent UTI who presents today for evaluation of possible UTI. Today she reports continued symptoms of intense burning and pain with urination.  She has urinary frequency every hour, urgency, and rare urge incontinence (1-2x/week).  She describes painful urethral spasms.  She reports frustration and significant bother from her symptoms. Overall, she states her symptoms began approximately 1 year ago.  They never resolve entirely, however she has had multiple "flares" during this time.  She states in the past she is felt that antibiotics has knocked out these flares.She has had 4 positive urine cultures over the past year with E. coli and Klebsiella species.  Has been placed on Myrbetriq and hyoscyamine  Today Frequency is stable.  Last culture negative.  Since taking the Myrbetriq no longer has pain or spasms and is feeling very good.  Frequency stable.  Clinically not infected today  Cystoscopy: Patient underwent flexible cystoscopy using sterile technique.  Bladder mucosa and trigone were normal.  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  There may have been a small area at 6:00 closer to the meatus.  The areas are not protruding into the urethra.  The urethra was little bit friable.   PMH: Past Medical History:  Diagnosis Date  . Anemia    H/O  . Anxiety   . Asthma    PT DENIES BUT PCP HAS LISTED ON H&P  . Chronic kidney disease    H/O KIDNEY STONES  . Complication of anesthesia   .  COPD (chronic obstructive pulmonary disease) (HCC)    PT DENIES BUT PCP HAS LISTED ON H&P  . Depression   . Hypertension   . PONV (postoperative nausea and vomiting)    NAUSEATED    Surgical History: Past Surgical History:  Procedure Laterality Date  . BREAST BIOPSY Right yrs ago   benign  . CYSTOSCOPY MACROPLASTIQUE IMPLANT N/A 04/12/2016   Procedure: CYSTOSCOPY MACROPLASTIQUE IMPLANT;  Surgeon: Orson Ape, MD;  Location: [facility] ORS;  Service: Urology;  Laterality: N/A;  . HAMMER TOE SURGERY    . HERNIA REPAIR    . TONSILLECTOMY    . TUBAL LIGATION      Home Medications:  Allergies as of 12/07/2020      Reactions   Amoxicillin Diarrhea   Amoxicillin-pot Clavulanate Other (See Comments)   Causes GI issues   Ivp Dye [iodinated Diagnostic Agents] Rash      Medication List       Accurate as of December 07, 2020  3:32 PM. If you have any questions, ask your nurse or doctor.        Cholecalciferol 25 MCG (1000 UT) tablet Take by mouth.   COSENTYX [place] Inject into the skin.   Hyoscyamine Sulfate SL 0.125 MG Subl Commonly known as: Levsin/SL 1-2 tabs dissolved under tongue every 6 hours as needed for frequency, urgency   lisinopril-hydrochlorothiazide 20-12.5 MG tablet Commonly known as: ZESTORETIC Take 1 tablet by mouth daily.   mirabegron ER 50 MG Tb24 tablet Commonly  known as: MYRBETRIQ Take 1 tablet (50 mg total) by mouth daily.   oxyCODONE-acetaminophen 5-325 MG tablet Commonly known as: PERCOCET/ROXICET Take 1 tablet by mouth every 6 (six) hours as needed for severe pain.   venlafaxine XR 37.5 MG 24 hr capsule Commonly known as: EFFEXOR-XR Take 37.5 mg by mouth daily.       Allergies:  Allergies  Allergen Reactions  . Amoxicillin Diarrhea  . Amoxicillin-Pot Clavulanate Other (See Comments)    Causes GI issues  . Ivp Dye [Iodinated Diagnostic Agents] Rash    Family History: Family History  Problem Relation Age of Onset  . Breast cancer  Mother 46    Social History:  reports that she has been smoking cigarettes. She has a 25.00 pack-year smoking history. She has never used smokeless tobacco. She reports current alcohol use. She reports that she does not use drugs.  ROS:                                        Physical Exam: There were no vitals taken for this visit.  Constitutional:  Alert and oriented, No acute distress.   Laboratory Data: Lab Results  Component Value Date   WBC 10.6 (H) 02/06/2020   HGB 14.1 02/06/2020   HCT 43.4 02/06/2020   MCV 95.0 02/06/2020   PLT 333 02/06/2020    Lab Results  Component Value Date   CREATININE 1.45 (H) 02/06/2020    No results found for: PSA  No results found for: TESTOSTERONE  No results found for: HGBA1C  Urinalysis    Component Value Date/Time   COLORURINE YELLOW (A) 02/06/2020 1708   APPEARANCEUR Cloudy (A) 11/18/2020 1000   LABSPEC 1.020 02/06/2020 1708   PHURINE 5.0 02/06/2020 1708   GLUCOSEU Negative 11/18/2020 1000   HGBUR NEGATIVE 02/06/2020 1708   BILIRUBINUR Negative 11/18/2020 1000   KETONESUR NEGATIVE 02/06/2020 1708   PROTEINUR 1+ (A) 11/18/2020 1000   PROTEINUR NEGATIVE 02/06/2020 1708   NITRITE Negative 11/18/2020 1000   NITRITE NEGATIVE 02/06/2020 1708   LEUKOCYTESUR Negative 11/18/2020 1000   LEUKOCYTESUR SMALL (A) 02/06/2020 1708    Pertinent Imaging:   Assessment & Plan: Picture drawn.  Patient is doing well on Myrbetriq but does get positive cultures.  She has a foreign body in the urethra which may or may not be the culprit.  The role of urinary prophylaxis discussed.  There was no calcification of the exposed area.  Otherwise watchful waiting for the cystoscopic finding.  The physiology described.  Reassess in 8 weeks on daily Macrodantin and Myrbetriq call if urine culture positive    1. Urinary frequency  - Urinalysis, Complete   No follow-ups on file.  Martina Sinner, MD  [facility] [address], [address] [place], [place] [address] [phone]

## 2020-12-08 LAB — URINALYSIS, COMPLETE
Bilirubin, UA: NEGATIVE
Glucose, UA: NEGATIVE
Leukocytes,UA: NEGATIVE
Nitrite, UA: NEGATIVE
Protein,UA: NEGATIVE
Specific Gravity, UA: 1.025 (ref 1.005–1.030)
Urobilinogen, Ur: 0.2 mg/dL (ref 0.2–1.0)
pH, UA: 5.5 (ref 5.0–7.5)

## 2020-12-08 LAB — MICROSCOPIC EXAMINATION

## 2021-02-01 ENCOUNTER — Ambulatory Visit: Payer: Self-pay | Admitting: Urology

## 2021-02-05 ENCOUNTER — Encounter: Payer: Self-pay | Admitting: Urology

## 2021-08-04 ENCOUNTER — Ambulatory Visit (INDEPENDENT_AMBULATORY_CARE_PROVIDER_SITE_OTHER): Payer: Medicare Other | Admitting: Urology

## 2021-08-04 ENCOUNTER — Encounter: Payer: Self-pay | Admitting: Urology

## 2021-08-04 ENCOUNTER — Other Ambulatory Visit: Payer: Self-pay

## 2021-08-04 VITALS — BP 115/71 | HR 86 | Ht 65.0 in | Wt 156.0 lb

## 2021-08-04 DIAGNOSIS — R300 Dysuria: Secondary | ICD-10-CM | POA: Diagnosis not present

## 2021-08-04 DIAGNOSIS — R350 Frequency of micturition: Secondary | ICD-10-CM

## 2021-08-04 DIAGNOSIS — N390 Urinary tract infection, site not specified: Secondary | ICD-10-CM

## 2021-08-04 MED ORDER — SULFAMETHOXAZOLE-TRIMETHOPRIM 800-160 MG PO TABS: 1.0000 | ORAL_TABLET | Freq: Two times a day (BID) | ORAL | 0 refills | Status: AC

## 2021-08-04 MED ORDER — HYOSCYAMINE SULFATE SL 0.125 MG SL SUBL: SUBLINGUAL_TABLET | SUBLINGUAL | 2 refills | Status: DC

## 2021-08-04 MED ORDER — MIRABEGRON ER 50 MG PO TB24: 50.0000 mg | ORAL_TABLET | Freq: Every day | ORAL | 0 refills | Status: DC

## 2021-08-04 NOTE — Progress Notes (Signed)
08/04/2021 8:38 AM   Kara Garza 05/21/1946 [id number]  Referring provider: Dorothey Baseman, MD [phone] [place],  [place] [address]  Chief Complaint  Patient presents with   Recurrent UTI    HPI: 74 y.o. female presents for follow-up of recurrent UTIs.  Last saw Dr. 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  Denies gross hematuria, fever or chills Myrbetriq has helped her symptoms however on her prescription pain and it is cost prohibitive She also sees improvement with sublingual hyoscyamine   PMH: Past Medical History:  Diagnosis Date   Anemia    H/O   Anxiety    Asthma    PT DENIES BUT PCP HAS LISTED ON H&P   Chronic kidney disease    H/O KIDNEY STONES   Complication of anesthesia    COPD (chronic obstructive pulmonary disease) (HCC)    PT DENIES BUT PCP HAS LISTED ON H&P   Depression    Hypertension    PONV (postoperative nausea and vomiting)    NAUSEATED    Surgical History: Past Surgical History:  Procedure Laterality Date   BREAST BIOPSY Right yrs ago   benign   CYSTOSCOPY MACROPLASTIQUE IMPLANT N/A 04/12/2016   Procedure: CYSTOSCOPY MACROPLASTIQUE IMPLANT;  Surgeon: Orson Ape, MD;  Location: [facility] ORS;  Service: Urology;  Laterality: N/A;   HAMMER TOE SURGERY     HERNIA REPAIR     TONSILLECTOMY     TUBAL LIGATION      Home Medications:  Allergies as of 08/04/2021       Reactions   Amoxicillin Diarrhea   Amoxicillin-pot Clavulanate Other (See Comments)   Causes GI issues   Ivp Dye [iodinated Diagnostic  Agents] Rash        Medication List        Accurate as of August 04, 2021  8:38 AM. If you have any questions, ask your nurse or doctor.          Cholecalciferol 25 MCG (1000 UT) tablet Take by mouth.   COSENTYX [place] Inject into the skin.   Hyoscyamine Sulfate SL 0.125 MG Subl Commonly known as: Levsin/SL 1-2 tabs dissolved under tongue every 6 hours as needed for frequency, urgency   lisinopril-hydrochlorothiazide 20-12.5 MG tablet Commonly known as: ZESTORETIC Take 1 tablet by mouth daily.   mirabegron ER 50 MG Tb24 tablet Commonly known as: MYRBETRIQ Take 1 tablet (50 mg total) by mouth daily. What changed: Another medication with the same name was added. Make sure you understand how and when to take each. Changed by: Riki Altes, MD   mirabegron ER 50 MG Tb24 tablet Commonly known as: MYRBETRIQ Take 1 tablet (50 mg total) by mouth daily. What changed: You were already taking a medication with the same name, and this prescription was added. Make sure you understand how and when to take each. Changed by: Riki Altes, MD   nitrofurantoin 100 MG capsule Commonly known as: Macrodantin Take 1 capsule (100 mg total) by mouth daily.   oxyCODONE-acetaminophen 5-325 MG tablet Commonly known as: PERCOCET/ROXICET Take  1 tablet by mouth every 6 (six) hours as needed for severe pain.   rOPINIRole 0.25 MG tablet Commonly known as: REQUIP Take by mouth.   sulfamethoxazole-trimethoprim 800-160 MG tablet Commonly known as: BACTRIM DS Take 1 tablet by mouth 2 (two) times daily for 7 days. Started by: Riki Altes, MD   venlafaxine XR 37.5 MG 24 hr capsule Commonly known as: EFFEXOR-XR Take 37.5 mg by mouth daily.        Allergies:  Allergies  Allergen Reactions   Amoxicillin Diarrhea   Amoxicillin-Pot Clavulanate Other (See Comments)    Causes GI issues   Ivp Dye [Iodinated Diagnostic Agents] Rash    Family History: Family History  Problem  Relation Age of Onset   Breast cancer Mother 27    Social History:  reports that she has been smoking cigarettes. She has a 25.00 pack-year smoking history. She has never used smokeless tobacco. She reports current alcohol use. She reports that she does not use drugs.   Physical Exam: BP 115/71   Pulse 86   Ht 5\' 5"  (1.651 m)   Wt 156 lb (70.8 kg)   BMI 25.96 kg/m   Constitutional:  Alert and oriented, No acute distress. HEENT: [place] AT, moist mucus membranes.  Trachea midline, no masses. Cardiovascular: No clubbing, cyanosis, or edema. Respiratory: Normal respiratory effort, no increased work of breathing. Neurologic: Grossly intact, no focal deficits, moving all 4 extremities. Psychiatric: Normal mood and affect.  Laboratory Data:  Urinalysis Dipstick 2+ blood/2+ leukocytes Microscopy >30 WBC/3-10 RBC   Assessment & Plan:    1.  Recurrent UTI Did well on a brief trial of low-dose prophylaxis Recurrent symptoms with significant pyuria Urine culture ordered and Rx Septra DS sent to pharmacy She [place] and would be interested on going briefly back on low-dose suppression during this time Will await urine culture results  2.  Lower urinary tract symptoms Storage related voiding symptoms most likely exacerbated by UTI Given additional Myrbetriq samples and hyoscyamine was refilled    November, MD  [facility] [address] [place], [place] [phone]

## 2021-08-05 LAB — URINALYSIS, COMPLETE
Bilirubin, UA: NEGATIVE
Glucose, UA: NEGATIVE
Nitrite, UA: NEGATIVE
Specific Gravity, UA: 1.03 (ref 1.005–1.030)
Urobilinogen, Ur: 0.2 mg/dL (ref 0.2–1.0)
pH, UA: 5.5 (ref 5.0–7.5)

## 2021-08-05 LAB — MICROSCOPIC EXAMINATION: WBC, UA: >30 /hpf — AB (ref 0–5)

## 2021-08-08 LAB — CULTURE, URINE COMPREHENSIVE

## 2021-08-09 ENCOUNTER — Telehealth: Payer: Self-pay | Admitting: *Deleted

## 2021-08-09 NOTE — Telephone Encounter (Signed)
-----   Message from Harle Battiest, PA-C sent at 08/09/2021  9:16 AM EDT ----- Please let Kara Garza know that her urine culture was positive and that the Septra that Dr. Lonna Cobb prescribed is the correct antibiotic and to finish that medication.  In regards to restarting her suppressive antibiotic, nitrofurantoin, her urine was infected with a multidrug resistant organism.  It would be better to not start a suppressive antibiotic at this time as it would further increase the resistance of the bacteria making it more difficult to treat in the future.

## 2021-08-09 NOTE — Telephone Encounter (Signed)
Notified patient as instructed, patient pleased. Discussed follow-up appointments, patient agrees  

## 2021-08-16 ENCOUNTER — Encounter: Payer: Self-pay | Admitting: Urology

## 2021-09-07 ENCOUNTER — Other Ambulatory Visit: Payer: Self-pay

## 2021-09-07 ENCOUNTER — Encounter: Payer: Self-pay | Admitting: Urology

## 2021-09-07 ENCOUNTER — Ambulatory Visit (INDEPENDENT_AMBULATORY_CARE_PROVIDER_SITE_OTHER): Payer: Medicare Other | Admitting: Urology

## 2021-09-07 VITALS — BP 150/65 | HR 96 | Ht 65.0 in | Wt 157.0 lb

## 2021-09-07 DIAGNOSIS — N390 Urinary tract infection, site not specified: Secondary | ICD-10-CM

## 2021-09-07 DIAGNOSIS — R300 Dysuria: Secondary | ICD-10-CM

## 2021-09-07 MED ORDER — NITROFURANTOIN MONOHYD MACRO 100 MG PO CAPS: 100.0000 mg | ORAL_CAPSULE | Freq: Two times a day (BID) | ORAL | 1 refills | Status: DC

## 2021-09-07 NOTE — Progress Notes (Signed)
09/07/2021 2:54 PM   Kara Garza 12-17-1946 [id number]  Referring provider: Dorothey Baseman, MD [phone] [place],  [place] [address]  Urological history: 1. Mixed incontinence -contributing factors of age, vaginal atrophy, anxiety, smoker, hyperglycemia, COPD and depression -Macroplastique 2017  2. rUTI's -contributing factors of age and vaginal atrophy -cysto 2021 NED -positive documented urine culture results  08/04/2021 MDRO Citrobacter amalonaticus resistant to ampicillin, cefazolin, ceftriaxone and cefuroxime  10/26/2020 Klebsiella pneumoniae resistant to ampicillin  10/21/2020 E. Coli and Klebsiella pneumoniae resistant to ampicillin  3. Bilateral nephrolithiasis -0.3 cm nonobstructing calculus in the midpole of the right kidney (2:55). There is a 0.2 cm nonobstructing renal calculus in the midpole of the left kidney non-contrast CT 10/2020       Chief Complaint  Patient presents with   Recurrent UTI    HPI: Kara Garza is a 75 y.o. female who presents today for burning with urination.  Two days ago, she started having frequency, urgency and dysuria.  Patient denies any modifying or aggravating factors.  Patient denies any gross hematuria, dysuria or suprapubic/flank pain.  Patient denies any fevers, chills, nausea or vomiting.    CATH UA nitrite positive, >30 WBC's, 3-10 RBC's and many bacteria.  PVR 40 mL.    She is engaging in UTI preventative strategies such as taking cranberry tablets, probiotics, applying vaginal estrogen cream, increasing water intake and avoiding soaking in water.    PMH: Past Medical History:  Diagnosis Date   Anemia    H/O   Anxiety    Asthma    PT DENIES BUT PCP HAS LISTED ON H&P   Chronic kidney disease    H/O KIDNEY STONES   Complication of anesthesia    COPD (chronic obstructive pulmonary disease) (HCC)    PT DENIES BUT PCP HAS LISTED ON H&P   Depression    Hypertension    PONV (postoperative nausea and vomiting)     NAUSEATED    Surgical History: Past Surgical History:  Procedure Laterality Date   BREAST BIOPSY Right yrs ago   benign   CYSTOSCOPY MACROPLASTIQUE IMPLANT N/A 04/12/2016   Procedure: CYSTOSCOPY MACROPLASTIQUE IMPLANT;  Surgeon: Orson Ape, MD;  Location: [facility] ORS;  Service: Urology;  Laterality: N/A;   HAMMER TOE SURGERY     HERNIA REPAIR     TONSILLECTOMY     TUBAL LIGATION      Home Medications:  Allergies as of 09/07/2021       Reactions   Amoxicillin Diarrhea   Amoxicillin-pot Clavulanate Other (See Comments)   Causes GI issues   Ivp Dye [iodinated Diagnostic Agents] Rash        Medication List        Accurate as of September 07, 2021 11:59 PM. If you have any questions, ask your nurse or doctor.          STOP taking these medications    oxyCODONE-acetaminophen 5-325 MG tablet Commonly known as: PERCOCET/ROXICET Stopped by: Michiel Cowboy, PA-C       TAKE these medications    Cholecalciferol 25 MCG (1000 UT) tablet Take by mouth.   COSENTYX [place] Inject into the skin.   Hyoscyamine Sulfate SL 0.125 MG Subl Commonly known as: Levsin/SL 1-2 tabs dissolved under tongue every 6 hours as needed for frequency, urgency   lisinopril-hydrochlorothiazide 20-12.5 MG tablet Commonly known as: ZESTORETIC Take 1 tablet by mouth daily.   mirabegron ER 50 MG Tb24 tablet Commonly known as: MYRBETRIQ Take 1  tablet (50 mg total) by mouth daily. What changed: Another medication with the same name was removed. Continue taking this medication, and follow the directions you see here. Changed by: Michiel Cowboy, PA-C   nitrofurantoin (macrocrystal-monohydrate) 100 MG capsule Commonly known as: MACROBID Take 1 capsule (100 mg total) by mouth every 12 (twelve) hours. Started by: Michiel Cowboy, PA-C   nitrofurantoin 100 MG capsule Commonly known as: Macrodantin Take 1 capsule (100 mg total) by mouth daily.   rOPINIRole 0.25 MG tablet Commonly known as:  REQUIP Take by mouth.   venlafaxine XR 37.5 MG 24 hr capsule Commonly known as: EFFEXOR-XR Take 37.5 mg by mouth daily.        Allergies:  Allergies  Allergen Reactions   Amoxicillin Diarrhea   Amoxicillin-Pot Clavulanate Other (See Comments)    Causes GI issues   Ivp Dye [Iodinated Diagnostic Agents] Rash    Family History: Family History  Problem Relation Age of Onset   Breast cancer Mother 51    Social History:  reports that she has been smoking cigarettes. She has a 25.00 pack-year smoking history. She has never used smokeless tobacco. She reports current alcohol use. She reports that she does not use drugs.  ROS: Pertinent ROS in HPI  Physical Exam: BP (!) 150/65 (BP Location: Left Arm, Patient Position: Sitting, Cuff Size: Normal)   Pulse 96   Ht 5\' 5"  (1.651 m)   Wt 157 lb (71.2 kg)   BMI 26.13 kg/m   Constitutional:  Well nourished. Alert and oriented, No acute distress. HEENT: [place] AT, mask in place.  Trachea midline Cardiovascular: No clubbing, cyanosis, or edema. Respiratory: Normal respiratory effort, no increased work of breathing. Skin: No rashes, bruises or suspicious lesions. Lymph: No cervical or inguinal adenopathy. Neurologic: Grossly intact, no focal deficits, moving all 4 extremities. Psychiatric: Normal mood and affect.  Laboratory Data: Urinalysis Component     Latest Ref Rng & Units 09/07/2021  Specific Gravity, UA     1.005 - 1.030 1.025  pH, UA     5.0 - 7.5 5.5  Color, UA     Yellow Yellow  Appearance Ur     Clear Cloudy (A)  Leukocytes,UA     Negative 3+ (A)  Protein,UA     Negative/Trace 1+ (A)  Glucose, UA     Negative Negative  Ketones, UA     Negative Negative  RBC, UA     Negative 1+ (A)  Bilirubin, UA     Negative Negative  Urobilinogen, Ur     0.2 - 1.0 mg/dL 0.2  Nitrite, UA     Negative Positive (A)  Microscopic Examination      See below:   Component     Latest Ref Rng & Units 09/07/2021  WBC, UA     0  - 5 /hpf >30 (A)  RBC     0 - 2 /hpf 3-10 (A)  Epithelial Cells (non renal)     0 - 10 /hpf 0-10  Casts     None seen /lpf Present (A)  Cast Type     N/A Hyaline casts  Bacteria, UA     None seen/Few Many (A)     I have reviewed the labs.   Pertinent Imaging: N/A  Assessment & Plan:    1. Dysuria -CATH Urinalysis, Complete-grossly infected -urine culture  -Start Macrobid 100 mg twice daily empirically, will adjust if necessary once culture results are available and then she will stay on Macrobid  daily for 3 months for suppressive antibiotic therapy  2. rUTI's -Continue preventative strategies -Suggested adding D mannose -We will have a course of 3 months of suppressive antibiotic therapy once urine culture results are available and current UTI has been treated -She will contact us for any breakthrough infections while on prophylactic antibiotic   Return in about 3 months (around 12/07/2021) for symptoms recheck .  These notes generated with voice recognition software. I apologize for typographical errors.  Michiel Cowboy, PA-C  [facility] [address]  [address] [place], [place] [address] [phone]

## 2021-09-07 NOTE — Progress Notes (Signed)
In and Out Catheterization  Patient is present today for a I & O catheterization due to recurrent UTI. Patient was cleaned and prepped in a sterile fashion with betadine. A 14FR cath was inserted no complications were noted, 67ml of urine return was noted, urine was dark yellow in color. A clean urine sample was collected for UA and Urine Culture. Bladder was drained, catheter was removed with out difficulty.    Preformed by: Debbe Bales, CMA   Follow up/ Additional notes: See provider's note.

## 2021-09-07 NOTE — Patient Instructions (Signed)
D-mannose

## 2021-09-08 LAB — URINALYSIS, COMPLETE
Bilirubin, UA: NEGATIVE
Glucose, UA: NEGATIVE
Ketones, UA: NEGATIVE
Nitrite, UA: POSITIVE — AB
Specific Gravity, UA: 1.025 (ref 1.005–1.030)
Urobilinogen, Ur: 0.2 mg/dL (ref 0.2–1.0)
pH, UA: 5.5 (ref 5.0–7.5)

## 2021-09-08 LAB — MICROSCOPIC EXAMINATION: WBC, UA: >30 /hpf — AB (ref 0–5)

## 2021-09-11 LAB — CULTURE, URINE COMPREHENSIVE

## 2021-10-07 ENCOUNTER — Ambulatory Visit: Payer: Medicare Other | Admitting: Urology

## 2021-10-20 NOTE — Progress Notes (Signed)
10/21/2021 9:27 PM   Kara Garza 11/14/1946 [id number]  Referring provider: Dorothey BasemanBronstein, David, MD [phone] [place],  [place] [phone]  Chief Complaint  Patient presents with   Follow-up     Urological history: 1. Mixed incontinence -contributing factors of age, vaginal atrophy, anxiety, smoker, hyperglycemia, COPD and depression -Macroplastique 2017  2. rUTI's -contributing factors of age and vaginal atrophy -cysto 2021 NED -positive documented urine culture results  09/07/2021 E.coli  08/04/2021 MDRO Citrobacter amalonaticus resistant to ampicillin, cefazolin, ceftriaxone and cefuroxime  10/26/2020 Klebsiella pneumoniae resistant to ampicillin  10/21/2020 E. Coli and Klebsiella pneumoniae resistant to ampicillin  3. Bilateral nephrolithiasis -0.3 cm nonobstructing calculus in the midpole of the right kidney (2:55). There is a 0.2 cm nonobstructing renal calculus in the midpole of the left kidney non-contrast CT 10/2020    Chief Complaint  Patient presents with   Follow-up     HPI: Kara Garza is a 75 y.o. female who presents today for recheck on UA for micro heme.  UA negative for micro heme  She still has a foul odor to her urine.  Patient denies any modifying or aggravating factors.  Patient denies any gross hematuria, dysuria or suprapubic/flank pain.  Patient denies any fevers, chills, nausea or vomiting.    She continues to drink water, take cranberry tablets/juice and is applying the vaginal estrogen cream.  She is not taking probiotics or d-mannose at this time.  PMH: Past Medical History:  Diagnosis Date   Anemia    H/O   Anxiety    Asthma    PT DENIES BUT PCP HAS LISTED ON H&P   Chronic kidney disease    H/O KIDNEY STONES   Complication of anesthesia    COPD (chronic obstructive pulmonary disease) (HCC)    PT DENIES BUT PCP HAS LISTED ON H&P   Depression    Hypertension    PONV (postoperative nausea and vomiting)    NAUSEATED     Surgical History: Past Surgical History:  Procedure Laterality Date   BREAST BIOPSY Right yrs ago   benign   CYSTOSCOPY MACROPLASTIQUE IMPLANT N/A 04/12/2016   Procedure: CYSTOSCOPY MACROPLASTIQUE IMPLANT;  Surgeon: Orson ApeMichael R Wolff, MD;  Location: [facility] ORS;  Service: Urology;  Laterality: N/A;   HAMMER TOE SURGERY     HERNIA REPAIR     TONSILLECTOMY     TUBAL LIGATION      Home Medications:  Allergies as of 10/21/2021       Reactions   Amoxicillin Diarrhea   Amoxicillin-pot Clavulanate Other (See Comments)   Causes GI issues   Ivp Dye [iodinated Diagnostic Agents] Rash        Medication List        Accurate as of October 21, 2021 11:59 PM. If you have any questions, ask your nurse or doctor.          STOP taking these medications    mirabegron ER 50 MG Tb24 tablet Commonly known as: MYRBETRIQ Stopped by: [place] Pelto, PA-C   nitrofurantoin 100 MG capsule Commonly known as: Macrodantin Stopped by: Michiel CowboySHANNON Breckyn Troyer, PA-C       TAKE these medications    amoxicillin 500 MG tablet Commonly known as: AMOXIL Take 1 tablet (500 mg total) by mouth 2 (two) times daily. Started by: Michiel CowboySHANNON Jaysion Ramseyer, PA-C   Cholecalciferol 25 MCG (1000 UT) tablet Take by mouth.   COSENTYX [place] Inject into the skin.   Hyoscyamine Sulfate SL 0.125 MG Subl Commonly  known as: Levsin/SL 1-2 tabs dissolved under tongue every 6 hours as needed for frequency, urgency   lisinopril-hydrochlorothiazide 20-12.5 MG tablet Commonly known as: ZESTORETIC Take 1 tablet by mouth daily.   nitrofurantoin (macrocrystal-monohydrate) 100 MG capsule Commonly known as: MACROBID Take 1 capsule (100 mg total) by mouth every 12 (twelve) hours.   rOPINIRole 0.25 MG tablet Commonly known as: REQUIP Take by mouth.   venlafaxine XR 37.5 MG 24 hr capsule Commonly known as: EFFEXOR-XR Take 37.5 mg by mouth daily.        Allergies:  Allergies  Allergen Reactions   Amoxicillin Diarrhea    Amoxicillin-Pot Clavulanate Other (See Comments)    Causes GI issues   Ivp Dye [Iodinated Diagnostic Agents] Rash    Family History: Family History  Problem Relation Age of Onset   Breast cancer Mother 50    Social History:  reports that she has been smoking cigarettes. She has a 25.00 pack-year smoking history. She has never used smokeless tobacco. She reports current alcohol use. She reports that she does not use drugs.  ROS: Pertinent ROS in HPI  Physical Exam: BP (!) 178/74   Pulse 92   Ht 5\' 5"  (1.651 m)   Wt 157 lb (71.2 kg)   BMI 26.13 kg/m   Constitutional:  Well nourished. Alert and oriented, No acute distress. HEENT: [name], mask in place.  Trachea midline Cardiovascular: No clubbing, cyanosis, or edema. Respiratory: Normal respiratory effort, no increased work of breathing. Neurologic: Grossly intact, no focal deficits, moving all 4 extremities. Psychiatric: Normal mood and affect.    Laboratory Data: Urinalysis Component     Latest Ref Rng & Units 10/21/2021  Specific Gravity, UA     1.005 - 1.030 1.025  pH, UA     5.0 - 7.5 5.5  Color, UA     Yellow Yellow  Appearance Ur     Clear Clear  Leukocytes,UA     Negative Trace (A)  Protein,UA     Negative/Trace Trace (A)  Glucose, UA     Negative Negative  Ketones, UA     Negative Negative  RBC, UA     Negative Trace (A)  Bilirubin, UA     Negative Negative  Urobilinogen, Ur     0.2 - 1.0 mg/dL 0.2  Nitrite, UA     Negative Negative  Microscopic Examination      See below:   Component     Latest Ref Rng & Units 10/21/2021  WBC, UA     0 - 5 /hpf 6-10 (A)  RBC     0 - 2 /hpf 0-2  Epithelial Cells (non renal)     0 - 10 /hpf >10 (H)  Mucus, UA     Not Estab. Present (A)  Bacteria, UA     None seen/Few Few (A)  I have reviewed the labs.   Pertinent Imaging: N/A  Assessment & Plan:    1. Dysuria/Microscopic hematuria -resolved  2. rUTI's -Continue preventative strategies -Suggested  adding D mannose  3. Malodorous urine -Given 3-day prescription for amoxicillin   Return in about 1 year (around 10/21/2022) for symptom recheck .  These notes generated with voice recognition software. I apologize for typographical errors.  13/02/2022, PA-C  [facility] [address]  [address] [place], [place] [phone]

## 2021-10-21 ENCOUNTER — Other Ambulatory Visit: Payer: Self-pay

## 2021-10-21 ENCOUNTER — Encounter: Payer: Self-pay | Admitting: Urology

## 2021-10-21 ENCOUNTER — Ambulatory Visit (INDEPENDENT_AMBULATORY_CARE_PROVIDER_SITE_OTHER): Payer: Medicare Other | Admitting: Urology

## 2021-10-21 VITALS — BP 178/74 | HR 92 | Ht 65.0 in | Wt 157.0 lb

## 2021-10-21 DIAGNOSIS — R3129 Other microscopic hematuria: Secondary | ICD-10-CM | POA: Diagnosis not present

## 2021-10-21 DIAGNOSIS — N390 Urinary tract infection, site not specified: Secondary | ICD-10-CM

## 2021-10-21 DIAGNOSIS — R8290 Unspecified abnormal findings in urine: Secondary | ICD-10-CM | POA: Diagnosis not present

## 2021-10-21 LAB — MICROSCOPIC EXAMINATION: Epithelial Cells (non renal): >10 /hpf — ABNORMAL HIGH (ref 0–10)

## 2021-10-21 LAB — URINALYSIS, COMPLETE
Bilirubin, UA: NEGATIVE
Glucose, UA: NEGATIVE
Ketones, UA: NEGATIVE
Nitrite, UA: NEGATIVE
Specific Gravity, UA: 1.025 (ref 1.005–1.030)
Urobilinogen, Ur: 0.2 mg/dL (ref 0.2–1.0)
pH, UA: 5.5 (ref 5.0–7.5)

## 2021-10-21 MED ORDER — AMOXICILLIN 500 MG PO TABS: 500.0000 mg | ORAL_TABLET | Freq: Two times a day (BID) | ORAL | 0 refills | Status: DC

## 2021-10-21 NOTE — Patient Instructions (Signed)
D-mannose  Probiotics

## 2022-01-03 ENCOUNTER — Other Ambulatory Visit: Payer: Self-pay | Admitting: Family Medicine

## 2022-01-03 DIAGNOSIS — Z1231 Encounter for screening mammogram for malignant neoplasm of breast: Secondary | ICD-10-CM

## 2022-02-14 ENCOUNTER — Ambulatory Visit
Admission: RE | Admit: 2022-02-14 | Discharge: 2022-02-14 | Disposition: A | Discharge status: Home / Self Care | Payer: Medicare Other | Source: Ambulatory Visit | Attending: Family Medicine | Admitting: Family Medicine

## 2022-02-14 ENCOUNTER — Other Ambulatory Visit: Payer: Self-pay

## 2022-02-14 DIAGNOSIS — Z1231 Encounter for screening mammogram for malignant neoplasm of breast: Secondary | ICD-10-CM | POA: Insufficient documentation

## 2022-07-07 ENCOUNTER — Encounter: Payer: Self-pay | Admitting: Physician Assistant

## 2022-07-07 ENCOUNTER — Ambulatory Visit (INDEPENDENT_AMBULATORY_CARE_PROVIDER_SITE_OTHER): Payer: Medicare Other | Admitting: Physician Assistant

## 2022-07-07 VITALS — BP 126/67 | HR 98 | Ht 65.0 in | Wt 157.0 lb

## 2022-07-07 DIAGNOSIS — N390 Urinary tract infection, site not specified: Secondary | ICD-10-CM

## 2022-07-07 LAB — URINALYSIS, COMPLETE
Bilirubin, UA: NEGATIVE
Glucose, UA: NEGATIVE
Ketones, UA: NEGATIVE
Nitrite, UA: NEGATIVE
Specific Gravity, UA: 1.025 (ref 1.005–1.030)
Urobilinogen, Ur: 0.2 mg/dL (ref 0.2–1.0)
pH, UA: 5.5 (ref 5.0–7.5)

## 2022-07-07 LAB — BLADDER SCAN AMB NON-IMAGING: Scan Result: 0

## 2022-07-07 LAB — MICROSCOPIC EXAMINATION

## 2022-07-07 MED ORDER — NITROFURANTOIN MONOHYD MACRO 100 MG PO CAPS: 100.0000 mg | ORAL_CAPSULE | Freq: Two times a day (BID) | ORAL | 0 refills | Status: DC

## 2022-07-07 NOTE — Progress Notes (Signed)
07/07/2022 9:12 AM   Kara Garza 05-13-1946 [id number]  CC: Chief Complaint  Patient presents with   Dysuria   HPI: Kara Garza is a 75 y.o. female with PMH mixed incontinence, recurrent UTIs previously on suppressive Macrobid, and nephrolithiasis who presents today for evaluation of possible UTI.   Today she reports a 3-day history of dysuria, urgency, and frequency.  She denies acute flank pain, nausea, vomiting, fever, chills, or gross hematuria.  She reports that this is her first UTI in approximately 10 months and she is thrilled with this.  In-office UA today positive for trace intact blood, 1+ protein, and trace leukocyte esterase; urine microscopy with 11-30 WBCs/HPF, 3-10 RBCs/HPF, and granular casts.   PMH: Past Medical History:  Diagnosis Date   Anemia    H/O   Anxiety    Asthma    PT DENIES BUT PCP HAS LISTED ON H&P   Chronic kidney disease    H/O KIDNEY STONES   Complication of anesthesia    COPD (chronic obstructive pulmonary disease) (HCC)    PT DENIES BUT PCP HAS LISTED ON H&P   Depression    Hypertension    PONV (postoperative nausea and vomiting)    NAUSEATED    Surgical History: Past Surgical History:  Procedure Laterality Date   BREAST BIOPSY Right yrs ago   benign   CYSTOSCOPY MACROPLASTIQUE IMPLANT N/A 04/12/2016   Procedure: CYSTOSCOPY MACROPLASTIQUE IMPLANT;  Surgeon: Orson Ape, MD;  Location: [facility] ORS;  Service: Urology;  Laterality: N/A;   HAMMER TOE SURGERY     HERNIA REPAIR     TONSILLECTOMY     TUBAL LIGATION      Home Medications:  Allergies as of 07/07/2022       Reactions   Amoxicillin Diarrhea   Amoxicillin-pot Clavulanate Other (See Comments)   Causes GI issues   Ivp Dye [iodinated Contrast Media] Rash        Medication List        Accurate as of July 07, 2022  9:12 AM. If you have any questions, ask your nurse or doctor.          STOP taking these medications    amoxicillin 500 MG tablet Commonly  known as: AMOXIL Stopped by: Carman Ching, PA-C       TAKE these medications    Cholecalciferol 25 MCG (1000 UT) tablet Take by mouth.   COSENTYX [place] Inject into the skin.   Hyoscyamine Sulfate SL 0.125 MG Subl Commonly known as: Levsin/SL 1-2 tabs dissolved under tongue every 6 hours as needed for frequency, urgency   lisinopril-hydrochlorothiazide 20-12.5 MG tablet Commonly known as: ZESTORETIC Take 1 tablet by mouth daily.   nitrofurantoin (macrocrystal-monohydrate) 100 MG capsule Commonly known as: MACROBID Take 1 capsule (100 mg total) by mouth 2 (two) times daily for 7 days. What changed: when to take this Changed by: Carman Ching, PA-C   rOPINIRole 0.25 MG tablet Commonly known as: REQUIP Take by mouth.   venlafaxine XR 37.5 MG 24 hr capsule Commonly known as: EFFEXOR-XR Take 37.5 mg by mouth daily.        Allergies:  Allergies  Allergen Reactions   Amoxicillin Diarrhea   Amoxicillin-Pot Clavulanate Other (See Comments)    Causes GI issues   Ivp Dye [Iodinated Contrast Media] Rash    Family History: Family History  Problem Relation Age of Onset   Breast cancer Mother 47    Social History:   reports that she has  been smoking cigarettes. She has a 25.00 pack-year smoking history. She has never used smokeless tobacco. She reports current alcohol use. She reports that she does not use drugs.  Physical Exam: BP 126/67   Pulse 98   Ht 5\' 5"  (1.651 m)   Wt 157 lb (71.2 kg)   BMI 26.13 kg/m   Constitutional:  Alert and oriented, no acute distress, nontoxic appearing HEENT: [place], AT Cardiovascular: No clubbing, cyanosis, or edema Respiratory: Normal respiratory effort, no increased work of breathing Skin: No rashes, bruises or suspicious lesions Neurologic: Grossly intact, no focal deficits, moving all 4 extremities Psychiatric: Normal mood and affect  Laboratory Data Results for orders placed or performed in visit on 07/07/22   Microscopic Examination   Urine  Result Value Ref Range   WBC, UA 11-30 (A) 0 - 5 /hpf   RBC, Urine 3-10 (A) 0 - 2 /hpf   Epithelial Cells (non renal) 0-10 0 - 10 /hpf   Casts Present (A) None seen /lpf   Cast Type Granular casts (A) N/A   Bacteria, UA Few None seen/Few  Urinalysis, Complete  Result Value Ref Range   Specific Gravity, UA 1.025 1.005 - 1.030   pH, UA 5.5 5.0 - 7.5   Color, UA Yellow Yellow   Appearance Ur Hazy (A) Clear   Leukocytes,UA Trace (A) Negative   Protein,UA 1+ (A) Negative/Trace   Glucose, UA Negative Negative   Ketones, UA Negative Negative   RBC, UA Trace (A) Negative   Bilirubin, UA Negative Negative   Urobilinogen, Ur 0.2 0.2 - 1.0 mg/dL   Nitrite, UA Negative Negative   Microscopic Examination See below:   Bladder Scan (Post Void Residual) in office  Result Value Ref Range   Scan Result 0    Assessment & Plan:   1. Recurrent UTI UA today notable for pyuria and microscopic hematuria consistent with acute cystitis.  Will start empiric Macrobid and send for culture for further evaluation.  We will plan to recheck her urine in 1 week to prove resolution of hematuria. - Urinalysis, Complete - Bladder Scan (Post Void Residual) in office - CULTURE, URINE COMPREHENSIVE - nitrofurantoin, macrocrystal-monohydrate, (MACROBID) 100 MG capsule; Take 1 capsule (100 mg total) by mouth 2 (two) times daily for 7 days.  Dispense: 14 capsule; Refill: 0  Return in about 1 week (around 07/14/2022) for Lab visit for UA.  07/16/2022, PA-C  [facility] [address] [place], [place] [phone]

## 2022-07-11 ENCOUNTER — Telehealth: Payer: Self-pay | Admitting: Family Medicine

## 2022-07-11 ENCOUNTER — Other Ambulatory Visit: Payer: Self-pay | Admitting: *Deleted

## 2022-07-11 DIAGNOSIS — R350 Frequency of micturition: Secondary | ICD-10-CM

## 2022-07-11 LAB — CULTURE, URINE COMPREHENSIVE

## 2022-07-11 MED ORDER — HYOSCYAMINE SULFATE SL 0.125 MG SL SUBL: SUBLINGUAL_TABLET | SUBLINGUAL | 2 refills | Status: DC

## 2022-07-11 MED ORDER — SULFAMETHOXAZOLE-TRIMETHOPRIM 800-160 MG PO TABS: 1.0000 | ORAL_TABLET | Freq: Two times a day (BID) | ORAL | 0 refills | Status: AC

## 2022-07-11 NOTE — Telephone Encounter (Signed)
Patient left message stating she is not getting any better on the Macrobid. She states she saw the results of the urine culture on Mychart and can see the ABX is resistant. She is requesting a different medication.

## 2022-07-14 ENCOUNTER — Encounter: Payer: Self-pay | Admitting: Physician Assistant

## 2022-07-14 ENCOUNTER — Other Ambulatory Visit: Payer: Self-pay | Admitting: *Deleted

## 2022-07-14 ENCOUNTER — Ambulatory Visit (INDEPENDENT_AMBULATORY_CARE_PROVIDER_SITE_OTHER): Payer: Medicare Other | Admitting: Physician Assistant

## 2022-07-14 ENCOUNTER — Emergency Department: Payer: Medicare Other

## 2022-07-14 ENCOUNTER — Other Ambulatory Visit: Payer: Self-pay

## 2022-07-14 ENCOUNTER — Emergency Department
Admission: EM | Admit: 2022-07-14 | Discharge: 2022-07-14 | Disposition: A | Discharge status: Home / Self Care | Payer: Medicare Other | Attending: Emergency Medicine | Admitting: Emergency Medicine

## 2022-07-14 VITALS — BP 163/70 | HR 115 | Temp 97.7°F | Ht 65.0 in | Wt 157.0 lb

## 2022-07-14 DIAGNOSIS — R300 Dysuria: Secondary | ICD-10-CM | POA: Diagnosis present

## 2022-07-14 DIAGNOSIS — R110 Nausea: Secondary | ICD-10-CM

## 2022-07-14 DIAGNOSIS — I129 Hypertensive chronic kidney disease with stage 1 through stage 4 chronic kidney disease, or unspecified chronic kidney disease: Secondary | ICD-10-CM | POA: Insufficient documentation

## 2022-07-14 DIAGNOSIS — R252 Cramp and spasm: Secondary | ICD-10-CM

## 2022-07-14 DIAGNOSIS — N189 Chronic kidney disease, unspecified: Secondary | ICD-10-CM | POA: Insufficient documentation

## 2022-07-14 DIAGNOSIS — R509 Fever, unspecified: Secondary | ICD-10-CM | POA: Diagnosis not present

## 2022-07-14 DIAGNOSIS — N201 Calculus of ureter: Secondary | ICD-10-CM | POA: Diagnosis not present

## 2022-07-14 DIAGNOSIS — R61 Generalized hyperhidrosis: Secondary | ICD-10-CM | POA: Diagnosis not present

## 2022-07-14 LAB — COMPREHENSIVE METABOLIC PANEL
ALT: 15 U/L (ref 0–44)
AST: 23 U/L (ref 15–41)
Albumin: 4.2 g/dL (ref 3.5–5.0)
Alkaline Phosphatase: 104 U/L (ref 38–126)
Anion gap: 11 (ref 5–15)
BUN: 19 mg/dL (ref 8–23)
CO2: 23 mmol/L (ref 22–32)
Calcium: 9.5 mg/dL (ref 8.9–10.3)
Chloride: 102 mmol/L (ref 98–111)
Creatinine, Ser: 1.55 mg/dL — ABNORMAL HIGH (ref 0.44–1.00)
GFR, Estimated: 35 mL/min — ABNORMAL LOW (ref >60)
Glucose, Bld: 96 mg/dL (ref 70–99)
Potassium: 3.9 mmol/L (ref 3.5–5.1)
Sodium: 136 mmol/L (ref 135–145)
Total Bilirubin: 0.6 mg/dL (ref 0.3–1.2)
Total Protein: 7.6 g/dL (ref 6.5–8.1)

## 2022-07-14 LAB — URINALYSIS, ROUTINE W REFLEX MICROSCOPIC
Bilirubin Urine: NEGATIVE
Glucose, UA: NEGATIVE mg/dL
Hgb urine dipstick: NEGATIVE
Ketones, ur: NEGATIVE mg/dL
Nitrite: NEGATIVE
Protein, ur: NEGATIVE mg/dL
Specific Gravity, Urine: 1.014 (ref 1.005–1.030)
pH: 6 (ref 5.0–8.0)

## 2022-07-14 LAB — CBC
HCT: 48 % — ABNORMAL HIGH (ref 36.0–46.0)
Hemoglobin: 15.5 g/dL — ABNORMAL HIGH (ref 12.0–15.0)
MCH: 30.1 pg (ref 26.0–34.0)
MCHC: 32.3 g/dL (ref 30.0–36.0)
MCV: 93.2 fL (ref 80.0–100.0)
Platelets: 301 10*3/uL (ref 150–400)
RBC: 5.15 MIL/uL — ABNORMAL HIGH (ref 3.87–5.11)
RDW: 12.5 % (ref 11.5–15.5)
WBC: 9.6 10*3/uL (ref 4.0–10.5)
nRBC: 0 % (ref 0.0–0.2)

## 2022-07-14 LAB — LIPASE, BLOOD: Lipase: 37 U/L (ref 11–51)

## 2022-07-14 MED ORDER — CEPHALEXIN 500 MG PO CAPS: 500.0000 mg | ORAL_CAPSULE | Freq: Two times a day (BID) | ORAL | 0 refills | Status: AC

## 2022-07-14 MED ORDER — METHYLPREDNISOLONE SODIUM SUCC 125 MG IJ SOLR
80.0000 mg | INTRAMUSCULAR | Status: AC
  Administered 2022-07-14: 80 mg via INTRAVENOUS
  Filled 2022-07-14: qty 2

## 2022-07-14 MED ORDER — LACTATED RINGERS IV BOLUS
1000.0000 mL | Freq: Once | INTRAVENOUS | Status: AC
  Administered 2022-07-14: 1000 mL via INTRAVENOUS

## 2022-07-14 MED ORDER — ONDANSETRON HCL 4 MG/2ML IJ SOLN
4.0000 mg | Freq: Once | INTRAMUSCULAR | Status: AC
  Administered 2022-07-14: 4 mg via INTRAVENOUS
  Filled 2022-07-14: qty 2

## 2022-07-14 NOTE — Discharge Instructions (Addendum)
Antibiotics, including Keflex, can cause diarrhea. Take a probiotic capsule or eat probiotic foods such as yogurt to minimize these side effects.

## 2022-07-14 NOTE — ED Notes (Signed)
First Nurse: Pt here from Urology with a fever and chills. Pt is on Bactrim for a UTI and went for a follow up with staff noticed she was tachycardic and having chills. Provider wanted her evaluated for a possible septic stone. Pt also pale at urology office.

## 2022-07-14 NOTE — ED Notes (Addendum)
See triage note. Body aches and dysuria for 1 week and has tried 2 abx to treat UTI. Hx of same since 3-4 years ago and also hx of renal stones.

## 2022-07-14 NOTE — ED Triage Notes (Signed)
Pt. To ED from home POV for painful urination, nausea, and body aches x1 week. Pt. Has tried macrobid, and bactrim with no alleviation of symptoms. Pt. Has hx of recurrent UTI's.

## 2022-07-14 NOTE — ED Provider Notes (Signed)
[facility] Provider Note    Event Date/Time   First MD Initiated Contact with Patient 07/14/22 1558     (approximate)   History   Chief Complaint: Dysuria (Pt. To ED from home POV for painful urination, nausea, and body aches x1 week. Pt. Has tried macrobid, and bactrim with no alleviation of symptoms. Pt. Has hx of recurrent UTI's. )   HPI  Kara Garza is a 76 y.o. female with a history of CKD, hypertension who comes ED complaining of dysuria, nausea, low back pain for the past week.  She was recently diagnosed with a UTI, urine culture a week ago showed Klebsiella which was resistant to Macrobid.  She was placed on Bactrim, and urgency/frequency have remained.  No fever. No vomiting, chest, pain, sob.       Physical Exam   Triage Vital Signs: ED Triage Vitals  Enc Vitals Group     BP 07/14/22 1505 (!) 107/95     Pulse Rate 07/14/22 1505 (!) 101     Resp 07/14/22 1505 18     Temp 07/14/22 1505 98.4 F (36.9 C)     Temp Source 07/14/22 1505 Oral     SpO2 07/14/22 1505 96 %     Weight 07/14/22 1506 157 lb (71.2 kg)     Height 07/14/22 1506 5\' 5"  (1.651 m)     Head Circumference --      Peak Flow --      Pain Score 07/14/22 1505 1     Pain Loc --      Pain Edu? --      Excl. in GC? --     Most recent vital signs: Vitals:   07/14/22 1700 07/14/22 1730  BP: (!) 148/59 (!) 165/56  Pulse: 85 86  Resp: 15   Temp:    SpO2: 96% 99%    General: Awake, no distress.  CV:  Good peripheral perfusion.  Resp:  Normal effort.  Abd:  No distention. Soft, nontender. No CVA tenderness Other:  Good spirits, energetic, moist mucosa.   ED Results / Procedures / Treatments   Labs (all labs ordered are listed, but only abnormal results are displayed) Labs Reviewed  URINALYSIS, ROUTINE W REFLEX MICROSCOPIC - Abnormal; Notable for the following components:      Result Value   Color, Urine YELLOW (*)    APPearance HAZY (*)    Leukocytes,Ua TRACE (*)     Bacteria, UA FEW (*)    All other components within normal limits  CBC - Abnormal; Notable for the following components:   RBC 5.15 (*)    Hemoglobin 15.5 (*)    HCT 48.0 (*)    All other components within normal limits  COMPREHENSIVE METABOLIC PANEL - Abnormal; Notable for the following components:   Creatinine, Ser 1.55 (*)    GFR, Estimated 35 (*)    All other components within normal limits  RESP PANEL BY RT-PCR (FLU A&B, COVID) ARPGX2  LIPASE, BLOOD     EKG    RADIOLOGY CT abd/pelvis interpreted by me, shows 63mm stone in distal R ureter. Radiology report reviewed   PROCEDURES:  Procedures   MEDICATIONS ORDERED IN ED: Medications  lactated ringers bolus 1,000 mL (0 mLs Intravenous Stopped 07/14/22 1814)  ondansetron (ZOFRAN) injection 4 mg (4 mg Intravenous Given 07/14/22 1648)  methylPREDNISolone sodium succinate (SOLU-MEDROL) 125 mg/2 mL injection 80 mg (80 mg Intravenous Given 07/14/22 1648)     IMPRESSION / MDM /  ASSESSMENT AND PLAN / ED COURSE  I reviewed the triage vital signs and the nursing notes.                              Differential diagnosis includes, but is not limited to, cystitis, pyelonephritis, AKI, kidney stone, appendicitis, diverticulitis, pancreatitis  Patient's presentation is most consistent with acute presentation with potential threat to life or bodily function.  Pt p/w back pain, persistent urinary symptoms despite bactrim for UTI, culture proven sensitivity.  Labs show baseline CKD, otherwise unremarkable. CT shows 61mm kidney stone at R UVJ.  After IVF, pt reports her pain has resolved and she feels back to normal. I suspect the stone has passed into her bladder after the CT.    Due to her recurrent UTIs in the past and the association of this stone with a recent UTI, I will continue her on an additional 5 days of abx to ensure eradication of the infection.  Based on improvement, she does not have pyelo, not septic.  She requests to  change abx, so I will rx keflex.       FINAL CLINICAL IMPRESSION(S) / ED DIAGNOSES   Final diagnoses:  Ureterolithiasis     Rx / DC Orders   ED Discharge Orders          Ordered    cephALEXin (KEFLEX) 500 MG capsule  2 times daily        07/14/22 1811             Note:  This document was prepared using Dragon voice recognition software and may include unintentional dictation errors.   Sharman Cheek, MD 07/14/22 1816

## 2022-07-14 NOTE — ED Notes (Signed)
Pt on toilet to provide UA sample.

## 2022-07-14 NOTE — Progress Notes (Signed)
Patient presented to clinic today for repeat UA to prove resolution of microscopic hematuria in the setting of UTI. She is on culture appropriate Bactrim after her urine culture grew Ampicillin and nitrofurantoin-resistant Klebsiella pneumoniae last week.  Today she reports a 3-day history of subjective fever, chills, diaphoresis, nausea, and arm cramping/pain. Triage vitals are notable for tachycardia of 115. She is normotensive, 163/70, and afebrile, 97.57F. On physical exam, she is pale, diaphoretic, and tremulous.  I discussed with her and her husband that I am concerned her symptoms and presentation may represent septic obstructing stone (despite the absence of flank pain) versus other pathology. I do not think Bactrim allergy or reaction is likely. I recommend proceeding to the ED for further evaluation at this time. She is in agreement with this plan.  Spoke with ED triage nurse regarding impending arrival.

## 2022-07-14 NOTE — ED Notes (Signed)
Pt up with no assist to commode. No needs at this  time

## 2022-07-26 ENCOUNTER — Other Ambulatory Visit: Payer: Self-pay | Admitting: Family Medicine

## 2022-07-26 ENCOUNTER — Ambulatory Visit (INDEPENDENT_AMBULATORY_CARE_PROVIDER_SITE_OTHER): Payer: Medicare Other | Admitting: Physician Assistant

## 2022-07-26 ENCOUNTER — Other Ambulatory Visit: Payer: Self-pay | Admitting: Physician Assistant

## 2022-07-26 ENCOUNTER — Ambulatory Visit
Admission: RE | Admit: 2022-07-26 | Discharge: 2022-07-26 | Disposition: A | Discharge status: Home / Self Care | Payer: Medicare Other | Attending: Physician Assistant | Admitting: Physician Assistant

## 2022-07-26 ENCOUNTER — Encounter: Payer: Self-pay | Admitting: Physician Assistant

## 2022-07-26 ENCOUNTER — Ambulatory Visit
Admission: RE | Admit: 2022-07-26 | Discharge: 2022-07-26 | Disposition: A | Discharge status: Home / Self Care | Payer: Medicare Other | Source: Ambulatory Visit | Attending: Physician Assistant | Admitting: Physician Assistant

## 2022-07-26 VITALS — BP 137/70 | HR 97 | Ht 65.0 in | Wt 161.0 lb

## 2022-07-26 DIAGNOSIS — N201 Calculus of ureter: Secondary | ICD-10-CM

## 2022-07-26 DIAGNOSIS — N390 Urinary tract infection, site not specified: Secondary | ICD-10-CM

## 2022-07-26 LAB — URINALYSIS, COMPLETE
Bilirubin, UA: NEGATIVE
Glucose, UA: NEGATIVE
Ketones, UA: NEGATIVE
Nitrite, UA: NEGATIVE
Specific Gravity, UA: 1.02 (ref 1.005–1.030)
Urobilinogen, Ur: 0.2 mg/dL (ref 0.2–1.0)
pH, UA: 5.5 (ref 5.0–7.5)

## 2022-07-26 LAB — MICROSCOPIC EXAMINATION: WBC, UA: >30 /hpf — AB (ref 0–5)

## 2022-07-26 MED ORDER — TAMSULOSIN HCL 0.4 MG PO CAPS: 0.4000 mg | ORAL_CAPSULE | Freq: Every day | ORAL | 0 refills | Status: DC

## 2022-07-26 MED ORDER — CEFTRIAXONE SODIUM 1 G IJ SOLR
1.0000 g | Freq: Once | INTRAMUSCULAR | Status: AC
  Administered 2022-07-26: 1 g via INTRAMUSCULAR

## 2022-07-26 MED ORDER — SULFAMETHOXAZOLE-TRIMETHOPRIM 800-160 MG PO TABS: 1.0000 | ORAL_TABLET | Freq: Two times a day (BID) | ORAL | 0 refills | Status: AC

## 2022-07-26 NOTE — Progress Notes (Signed)
Surgical Physician Order Form [facility]  Dr. Lonna Cobb * Scheduling expectation :  08/02/2022  *Length of Case:   *Clearance needed: no  *Anticoagulation Instructions: N/A  *Aspirin Instructions: N/A  *Post-op visit Date/Instructions:  1 month follow up  *Diagnosis: Right Ureteral Stone  *Procedure: right Ureteroscopy w/laser lithotripsy & stent placement (08144)   Additional orders: N/A  -Admit type: OUTpatient  -Anesthesia: General  -VTE Prophylaxis Standing Order SCD's       Other:   -Standing Lab Orders Per Anesthesia    Lab other: None  -Standing Test orders EKG/Chest x-ray per Anesthesia       Test other:   - Medications:  Ancef 1gm IV  -Other orders:  N/A

## 2022-07-26 NOTE — H&P (View-Only) (Signed)
07/26/2022 4:18 PM   [name] 12/22/1945 [id number]  CC: Chief Complaint  Patient presents with   Flank Pain   HPI: Kara Garza is a 76 y.o. female with PMH mixed incontinence, recurrent UTIs, and nephrolithiasis with a recent persistent versus recurrent UTI in the setting of a 3 mm right UVJ stone who presents today for persistent UTI symptoms.   Today she reports she never saw a stone pass after being diagnosed with right UVJ stone in the ED on 07/14/2022.  She completed Keflex 3 days ago with most recent urine culture growing ampicillin and nitrofurantoin resistant Klebsiella pneumoniae.  She states her dysuria, urgency, and frequency resumed yesterday.  She denies fever, chills, nausea, or vomiting.  Notably, no hydronephrosis noted on her recent CT scan.  She underwent KUB today with no radiopaque distal ureteral stone.  In-office UA today positive for trace intact blood, 2+ protein, and 1+ leukocyte esterase; urine microscopy with >30 WBCs/HPF, 3-10 RBCs/HPF, and many bacteria.   PMH: Past Medical History:  Diagnosis Date   Anemia    H/O   Anxiety    Asthma    PT DENIES BUT PCP HAS LISTED ON H&P   Chronic kidney disease    H/O KIDNEY STONES   Complication of anesthesia    COPD (chronic obstructive pulmonary disease) ([place])    PT DENIES BUT PCP HAS LISTED ON H&P   Depression    Hypertension    PONV (postoperative nausea and vomiting)    NAUSEATED    Surgical History: Past Surgical History:  Procedure Laterality Date   BREAST BIOPSY Right yrs ago   benign   CYSTOSCOPY MACROPLASTIQUE IMPLANT N/A 04/12/2016   Procedure: CYSTOSCOPY MACROPLASTIQUE IMPLANT;  Surgeon: Royston Cowper, MD;  Location: [facility] ORS;  Service: Urology;  Laterality: N/A;   HAMMER TOE SURGERY     HERNIA REPAIR     TONSILLECTOMY     TUBAL LIGATION      Home Medications:  Allergies as of 07/26/2022       Reactions   Amoxicillin Diarrhea   Amoxicillin-pot Clavulanate Other (See Comments)    Causes GI issues   Ivp Dye [iodinated Contrast Media] Rash        Medication List        Accurate as of July 26, 2022  4:18 PM. If you have any questions, ask your nurse or doctor.          Cholecalciferol 25 MCG (1000 UT) tablet Take by mouth.   COSENTYX Citrus Inject into the skin.   Hyoscyamine Sulfate SL 0.125 MG Subl Commonly known as: Levsin/SL 1-2 tabs dissolved under tongue every 6 hours as needed for frequency, urgency   lisinopril-hydrochlorothiazide 20-12.5 MG tablet Commonly known as: ZESTORETIC Take 1 tablet by mouth daily.   rOPINIRole 0.25 MG tablet Commonly known as: REQUIP Take by mouth.   sulfamethoxazole-trimethoprim 800-160 MG tablet Commonly known as: BACTRIM DS Take 1 tablet by mouth 2 (two) times daily for 7 days. Started by: Debroah Loop, PA-C   tamsulosin 0.4 MG Caps capsule Commonly known as: FLOMAX Take 1 capsule (0.4 mg total) by mouth daily. Started by: Debroah Loop, PA-C   venlafaxine XR 37.5 MG 24 hr capsule Commonly known as: EFFEXOR-XR Take 37.5 mg by mouth daily.        Allergies:  Allergies  Allergen Reactions   Amoxicillin Diarrhea   Amoxicillin-Pot Clavulanate Other (See Comments)    Causes GI issues   Ivp Dye [Iodinated  Contrast Media] Rash    Family History: Family History  Problem Relation Age of Onset   Breast cancer Mother 98    Social History:   reports that she has been smoking cigarettes. She has a 25.00 pack-year smoking history. She has never used smokeless tobacco. She reports current alcohol use. She reports that she does not use drugs.  Physical Exam: BP 137/70   Pulse 97   Ht _0  (1.651 m)   Wt 161 lb (73 kg)   BMI 26.79 kg/m   Constitutional:  Alert and oriented, no acute distress, nontoxic appearing HEENT: [place], AT Cardiovascular: No clubbing, cyanosis, or edema Respiratory: Normal respiratory effort, no increased work of breathing Skin: No rashes, bruises or  suspicious lesions Neurologic: Grossly intact, no focal deficits, moving all 4 extremities Psychiatric: Normal mood and affect  Laboratory Data: Results for orders placed or performed in visit on 07/26/22  Microscopic Examination   Urine  Result Value Ref Range   WBC, UA >30 (A) 0 - 5 /hpf   RBC, Urine 3-10 (A) 0 - 2 /hpf   Epithelial Cells (non renal) 0-10 0 - 10 /hpf   Bacteria, UA Many (A) None seen/Few  Urinalysis, Complete  Result Value Ref Range   Specific Gravity, UA 1.020 1.005 - 1.030   pH, UA 5.5 5.0 - 7.5   Color, UA Yellow Yellow   Appearance Ur Hazy (A) Clear   Leukocytes,UA 1+ (A) Negative   Protein,UA 2+ (A) Negative/Trace   Glucose, UA Negative Negative   Ketones, UA Negative Negative   RBC, UA Trace (A) Negative   Bilirubin, UA Negative Negative   Urobilinogen, Ur 0.2 0.2 - 1.0 mg/dL   Nitrite, UA Negative Negative   Microscopic Examination See below:    Pertinent Imaging: KUB, 07/26/2022: CLINICAL DATA:  Right UVJ calculus   EXAM: ABDOMEN - 1 VIEW   COMPARISON:  07/14/2022   FINDINGS: 2 supine frontal views of the abdomen and pelvis excludes portions of the left flank and bilateral hemidiaphragms by collimation. The right UVJ calculus seen on prior CT is not identified on this radiograph. There is a stable 2 mm calculus upper pole left kidney. No other urinary tract calculi. Pelvic vascular calcifications are again noted. Bowel gas pattern is unremarkable. No abdominal masses. Stable degenerative changes and left convex scoliosis within the lumbar spine.   IMPRESSION: 1. Interval passage or removal of the right UVJ calculus seen on prior CT. 2. Stable punctate left renal calculus.     Electronically Signed   By: Randa Ngo M.D.   On: 07/26/2022 16:51  I personally reviewed the images referenced above and note no radiopaque distal ureteral stone..  Assessment & Plan:   1. Right ureteral stone Patient never saw a stone pass and I think  retained stone is likely to explain her persistent UTI as below.  We will start Flomax for MET, but I recommended proceeding with definitive stone management given #2 below.  She is in agreement with this plan.  We discussed that we cannot pursue ESWL in the absence of radiopaque stone.  I recommended pursuing ureteroscopy with laser lithotripsy and stent placement.  We discussed that ureteral stents are temporary but can cause flank pain, bladder pain, dysuria, urgency, frequency, urge incontinence, and gross hematuria.  She expressed understanding and is in agreement with this plan.  We discussed that if she spontaneously passes her stone within the next week, she should contact our office and we can cancel  her planned surgery. - Urinalysis, Complete - tamsulosin (FLOMAX) 0.4 MG CAPS capsule; Take 1 capsule (0.4 mg total) by mouth daily.  Dispense: 30 capsule; Refill: 0  2. Complicated UTI (urinary tract infection) UA appears grossly infected again today, though her vitals are stable and she is not ill-appearing.  I think her symptoms are more consistent with cystitis in the setting of ureteral stone rather than a septic, obstructing stone picture, but I was honest with her that she is at risk for developing sepsis.  I offered her a shot of Rocephin and will start her on empiric Bactrim x7 days which should last until her planned surgery date.  She is in agreement with this plan.  We discussed return precautions including fever, chills, uncontrollable pain, or uncontrollable nausea/vomiting. - CULTURE, URINE COMPREHENSIVE - cefTRIAXone (ROCEPHIN) injection 1 g - sulfamethoxazole-trimethoprim (BACTRIM DS) 800-160 MG tablet; Take 1 tablet by mouth 2 (two) times daily for 7 days.  Dispense: 14 tablet; Refill: 0  Return in 1 week (on 08/02/2022) for Right ureteroscopy with laser lithotripsy and stent placement with Dr. Bernardo Heater.  Debroah Loop, PA-C  [facility] [address], [place], [place] [address] [phone]

## 2022-07-26 NOTE — Progress Notes (Signed)
07/26/2022 4:18 PM   Kara Garza 12/22/1945 [id number]  CC: Chief Complaint  Patient presents with   Flank Pain   HPI: Kara Garza is a 76 y.o. female with PMH mixed incontinence, recurrent UTIs, and nephrolithiasis with a recent persistent versus recurrent UTI in the setting of a 3 mm right UVJ stone who presents today for persistent UTI symptoms.   Today she reports she never saw a stone pass after being diagnosed with right UVJ stone in the ED on 07/14/2022.  She completed Keflex 3 days ago with most recent urine culture growing ampicillin and nitrofurantoin resistant Klebsiella pneumoniae.  She states her dysuria, urgency, and frequency resumed yesterday.  She denies fever, chills, nausea, or vomiting.  Notably, no hydronephrosis noted on her recent CT scan.  She underwent KUB today with no radiopaque distal ureteral stone.  In-office UA today positive for trace intact blood, 2+ protein, and 1+ leukocyte esterase; urine microscopy with >30 WBCs/HPF, 3-10 RBCs/HPF, and many bacteria.   PMH: Past Medical History:  Diagnosis Date   Anemia    H/O   Anxiety    Asthma    PT DENIES BUT PCP HAS LISTED ON H&P   Chronic kidney disease    H/O KIDNEY STONES   Complication of anesthesia    COPD (chronic obstructive pulmonary disease) ([place])    PT DENIES BUT PCP HAS LISTED ON H&P   Depression    Hypertension    PONV (postoperative nausea and vomiting)    NAUSEATED    Surgical History: Past Surgical History:  Procedure Laterality Date   BREAST BIOPSY Right yrs ago   benign   CYSTOSCOPY MACROPLASTIQUE IMPLANT N/A 04/12/2016   Procedure: CYSTOSCOPY MACROPLASTIQUE IMPLANT;  Surgeon: Royston Cowper, MD;  Location: [facility];  Service: Urology;  Laterality: N/A;   HAMMER TOE SURGERY     HERNIA REPAIR     TONSILLECTOMY     TUBAL LIGATION      Home Medications:  Allergies as of 07/26/2022       Reactions   Amoxicillin Diarrhea   Amoxicillin-pot Clavulanate Other (See Comments)    Causes GI issues   Ivp Dye [iodinated Contrast Media] Rash        Medication List        Accurate as of July 26, 2022  4:18 PM. If you have any questions, ask your nurse or doctor.          Cholecalciferol 25 MCG (1000 UT) tablet Take by mouth.   COSENTYX Citrus Inject into the skin.   Hyoscyamine Sulfate SL 0.125 MG Subl Commonly known as: Levsin/SL 1-2 tabs dissolved under tongue every 6 hours as needed for frequency, urgency   lisinopril-hydrochlorothiazide 20-12.5 MG tablet Commonly known as: ZESTORETIC Take 1 tablet by mouth daily.   rOPINIRole 0.25 MG tablet Commonly known as: REQUIP Take by mouth.   sulfamethoxazole-trimethoprim 800-160 MG tablet Commonly known as: BACTRIM DS Take 1 tablet by mouth 2 (two) times daily for 7 days. Started by: Debroah Loop, PA-C   tamsulosin 0.4 MG Caps capsule Commonly known as: FLOMAX Take 1 capsule (0.4 mg total) by mouth daily. Started by: Debroah Loop, PA-C   venlafaxine XR 37.5 MG 24 hr capsule Commonly known as: EFFEXOR-XR Take 37.5 mg by mouth daily.        Allergies:  Allergies  Allergen Reactions   Amoxicillin Diarrhea   Amoxicillin-Pot Clavulanate Other (See Comments)    Causes GI issues   Ivp Dye [Iodinated  Contrast Media] Rash    Family History: Family History  Problem Relation Age of Onset   Breast cancer Mother 98    Social History:   reports that she has been smoking cigarettes. She has a 25.00 pack-year smoking history. She has never used smokeless tobacco. She reports current alcohol use. She reports that she does not use drugs.  Physical Exam: BP 137/70   Pulse 97   Ht _0  (1.651 m)   Wt 161 lb (73 kg)   BMI 26.79 kg/m   Constitutional:  Alert and oriented, no acute distress, nontoxic appearing HEENT: [place], AT Cardiovascular: No clubbing, cyanosis, or edema Respiratory: Normal respiratory effort, no increased work of breathing Skin: No rashes, bruises or  suspicious lesions Neurologic: Grossly intact, no focal deficits, moving all 4 extremities Psychiatric: Normal mood and affect  Laboratory Data: Results for orders placed or performed in visit on 07/26/22  Microscopic Examination   Urine  Result Value Ref Range   WBC, UA >30 (A) 0 - 5 /hpf   RBC, Urine 3-10 (A) 0 - 2 /hpf   Epithelial Cells (non renal) 0-10 0 - 10 /hpf   Bacteria, UA Many (A) None seen/Few  Urinalysis, Complete  Result Value Ref Range   Specific Gravity, UA 1.020 1.005 - 1.030   pH, UA 5.5 5.0 - 7.5   Color, UA Yellow Yellow   Appearance Ur Hazy (A) Clear   Leukocytes,UA 1+ (A) Negative   Protein,UA 2+ (A) Negative/Trace   Glucose, UA Negative Negative   Ketones, UA Negative Negative   RBC, UA Trace (A) Negative   Bilirubin, UA Negative Negative   Urobilinogen, Ur 0.2 0.2 - 1.0 mg/dL   Nitrite, UA Negative Negative   Microscopic Examination See below:    Pertinent Imaging: KUB, 07/26/2022: CLINICAL DATA:  Right UVJ calculus   EXAM: ABDOMEN - 1 VIEW   COMPARISON:  07/14/2022   FINDINGS: 2 supine frontal views of the abdomen and pelvis excludes portions of the left flank and bilateral hemidiaphragms by collimation. The right UVJ calculus seen on prior CT is not identified on this radiograph. There is a stable 2 mm calculus upper pole left kidney. No other urinary tract calculi. Pelvic vascular calcifications are again noted. Bowel gas pattern is unremarkable. No abdominal masses. Stable degenerative changes and left convex scoliosis within the lumbar spine.   IMPRESSION: 1. Interval passage or removal of the right UVJ calculus seen on prior CT. 2. Stable punctate left renal calculus.     Electronically Signed   By: Randa Ngo M.D.   On: 07/26/2022 16:51  I personally reviewed the images referenced above and note no radiopaque distal ureteral stone..  Assessment & Plan:   1. Right ureteral stone Patient never saw a stone pass and I think  retained stone is likely to explain her persistent UTI as below.  We will start Flomax for MET, but I recommended proceeding with definitive stone management given #2 below.  She is in agreement with this plan.  We discussed that we cannot pursue ESWL in the absence of radiopaque stone.  I recommended pursuing ureteroscopy with laser lithotripsy and stent placement.  We discussed that ureteral stents are temporary but can cause flank pain, bladder pain, dysuria, urgency, frequency, urge incontinence, and gross hematuria.  She expressed understanding and is in agreement with this plan.  We discussed that if she spontaneously passes her stone within the next week, she should contact our office and we can cancel  her planned surgery. - Urinalysis, Complete - tamsulosin (FLOMAX) 0.4 MG CAPS capsule; Take 1 capsule (0.4 mg total) by mouth daily.  Dispense: 30 capsule; Refill: 0  2. Complicated UTI (urinary tract infection) UA appears grossly infected again today, though her vitals are stable and she is not ill-appearing.  I think her symptoms are more consistent with cystitis in the setting of ureteral stone rather than a septic, obstructing stone picture, but I was honest with her that she is at risk for developing sepsis.  I offered her a shot of Rocephin and will start her on empiric Bactrim x7 days which should last until her planned surgery date.  She is in agreement with this plan.  We discussed return precautions including fever, chills, uncontrollable pain, or uncontrollable nausea/vomiting. - CULTURE, URINE COMPREHENSIVE - cefTRIAXone (ROCEPHIN) injection 1 g - sulfamethoxazole-trimethoprim (BACTRIM DS) 800-160 MG tablet; Take 1 tablet by mouth 2 (two) times daily for 7 days.  Dispense: 14 tablet; Refill: 0  Return in 1 week (on 08/02/2022) for Right ureteroscopy with laser lithotripsy and stent placement with Dr. Bernardo Heater.  Debroah Loop, PA-C  [facility] [address], [place], [place] [address] [phone]

## 2022-07-26 NOTE — Patient Instructions (Signed)
I'm getting you scheduled for a ureteroscopy with Dr. Lonna Cobb to treat your stone next Tuesday. In the meantime, please do the following: -Take Flomax 0.4mg  daily as prescribed today -Take Bactrim antibiotic twice daily starting tomorrow (Wednesday) afternoon.  If you see your stone pass in the next week, call me so we can cancel your surgery.  Please go to the Emergency Department if you develop any of the following: -Fever -Chills -Nausea and/or vomiting -Severe Pain

## 2022-07-27 ENCOUNTER — Telehealth: Payer: Self-pay

## 2022-07-27 NOTE — Telephone Encounter (Signed)
Called patient to schedule surgery. No answer, did leave detailed voicemail for patient to return my call.

## 2022-07-29 ENCOUNTER — Telehealth: Payer: Self-pay | Admitting: *Deleted

## 2022-07-29 ENCOUNTER — Emergency Department: Payer: Medicare Other

## 2022-07-29 ENCOUNTER — Other Ambulatory Visit: Payer: Self-pay

## 2022-07-29 ENCOUNTER — Emergency Department
Admission: EM | Admit: 2022-07-29 | Discharge: 2022-07-29 | Disposition: A | Discharge status: Home / Self Care | Payer: Medicare Other | Attending: Student in an Organized Health Care Education/Training Program | Admitting: Student in an Organized Health Care Education/Training Program

## 2022-07-29 DIAGNOSIS — E860 Dehydration: Secondary | ICD-10-CM | POA: Insufficient documentation

## 2022-07-29 DIAGNOSIS — R5381 Other malaise: Secondary | ICD-10-CM | POA: Diagnosis present

## 2022-07-29 DIAGNOSIS — R110 Nausea: Secondary | ICD-10-CM | POA: Insufficient documentation

## 2022-07-29 LAB — BASIC METABOLIC PANEL
Anion gap: 12 (ref 5–15)
BUN: 16 mg/dL (ref 8–23)
CO2: 23 mmol/L (ref 22–32)
Calcium: 9.2 mg/dL (ref 8.9–10.3)
Chloride: 101 mmol/L (ref 98–111)
Creatinine, Ser: 1.25 mg/dL — ABNORMAL HIGH (ref 0.44–1.00)
GFR, Estimated: 45 mL/min — ABNORMAL LOW (ref >60)
Glucose, Bld: 138 mg/dL — ABNORMAL HIGH (ref 70–99)
Potassium: 3.6 mmol/L (ref 3.5–5.1)
Sodium: 136 mmol/L (ref 135–145)

## 2022-07-29 LAB — CBC
HCT: 43.2 % (ref 36.0–46.0)
Hemoglobin: 14.1 g/dL (ref 12.0–15.0)
MCH: 30.1 pg (ref 26.0–34.0)
MCHC: 32.6 g/dL (ref 30.0–36.0)
MCV: 92.1 fL (ref 80.0–100.0)
Platelets: 221 10*3/uL (ref 150–400)
RBC: 4.69 MIL/uL (ref 3.87–5.11)
RDW: 12.5 % (ref 11.5–15.5)
WBC: 6.5 10*3/uL (ref 4.0–10.5)
nRBC: 0 % (ref 0.0–0.2)

## 2022-07-29 LAB — URINALYSIS, ROUTINE W REFLEX MICROSCOPIC
Bilirubin Urine: NEGATIVE
Glucose, UA: NEGATIVE mg/dL
Hgb urine dipstick: NEGATIVE
Ketones, ur: NEGATIVE mg/dL
Leukocytes,Ua: NEGATIVE
Nitrite: NEGATIVE
Protein, ur: NEGATIVE mg/dL
Specific Gravity, Urine: 1.01 (ref 1.005–1.030)
pH: 7 (ref 5.0–8.0)

## 2022-07-29 LAB — CULTURE, URINE COMPREHENSIVE

## 2022-07-29 MED ORDER — SODIUM CHLORIDE 0.9 % IV BOLUS
1000.0000 mL | Freq: Once | INTRAVENOUS | Status: AC
  Administered 2022-07-29: 1000 mL via INTRAVENOUS

## 2022-07-29 NOTE — Telephone Encounter (Signed)
I have tried to call patient on Thursday 08/10 and 08/11 without answer to schedule surgery.

## 2022-07-29 NOTE — ED Provider Notes (Signed)
[facility] Provider Note    Event Date/Time   First MD Initiated Contact with Patient 07/29/22 1545     (approximate)   History   Weakness and Recurrent UTI   HPI  Kara Garza is a 76 y.o. female with history of recurrent UTIs recent diagnosis of Klebsiella UTI and on review of record we have a more recent urine culture growing out E. coli sensitive to cephalosporins currently on Bactrim presents to the ER for generalized malaise nausea feeling dehydrated.  She also has history of suspected right UVJ stone with plan for ureteral stent placement on Tuesday of next week.  She denies any fevers or chills.  Is having some nausea discomfort.     Physical Exam   Triage Vital Signs: ED Triage Vitals  Enc Vitals Group     BP 07/29/22 0928 (!) 142/63     Pulse Rate 07/29/22 0928 (!) 104     Resp 07/29/22 0928 19     Temp 07/29/22 0928 98.4 F (36.9 C)     Temp Source 07/29/22 0928 Oral     SpO2 07/29/22 0928 98 %     Weight 07/29/22 0938 160 lb 15 oz (73 kg)     Height 07/29/22 0938 5\' 5"  (1.651 m)     Head Circumference --      Peak Flow --      Pain Score 07/29/22 0938 0     Pain Loc --      Pain Edu? --      Excl. in GC? --     Most recent vital signs: Vitals:   07/29/22 0928 07/29/22 1645  BP: (!) 142/63 (!) 156/90  Pulse: (!) 104 92  Resp: 19   Temp: 98.4 F (36.9 C)   SpO2: 98% 98%     Constitutional: Alert well appearing Eyes: Conjunctivae are normal.  Head: Atraumatic. Nose: No congestion/rhinnorhea. Mouth/Throat: Mucous membranes are moist.   Neck: Painless ROM.  Cardiovascular:   Good peripheral circulation. Respiratory: Normal respiratory effort.  No retractions.  Gastrointestinal: Soft and nontender.  Musculoskeletal:  no deformity Neurologic:  MAE spontaneously. No gross focal neurologic deficits are appreciated.  Skin:  Skin is warm, dry and intact. No rash noted. Psychiatric: Mood and affect are normal. Speech and  behavior are normal.    ED Results / Procedures / Treatments   Labs (all labs ordered are listed, but only abnormal results are displayed) Labs Reviewed  BASIC METABOLIC PANEL - Abnormal; Notable for the following components:      Result Value   Glucose, Bld 138 (*)    Creatinine, Ser 1.25 (*)    GFR, Estimated 45 (*)    All other components within normal limits  URINALYSIS, ROUTINE W REFLEX MICROSCOPIC - Abnormal; Notable for the following components:   Color, Urine YELLOW (*)    APPearance CLEAR (*)    All other components within normal limits  CBC  CBG MONITORING, ED     EKG  ED ECG REPORT I, 09/28/22, the attending physician, personally viewed and interpreted this ECG.   Date: 07/29/2022  EKG Time: 9:40  Rate: 115  Rhythm: sinus  Axis: normal  Intervals: normal  ST&T Change: no stemi, no depressions    RADIOLOGY Please see ED Course for my review and interpretation.  I personally reviewed all radiographic images ordered to evaluate for the above acute complaints and reviewed radiology reports and findings.  These findings were personally discussed with  the patient.  Please see medical record for radiology report.    PROCEDURES:  Critical Care performed: No  Procedures   MEDICATIONS ORDERED IN ED: Medications  sodium chloride 0.9 % bolus 1,000 mL (0 mLs Intravenous Stopped 07/29/22 1758)     IMPRESSION / MDM / ASSESSMENT AND PLAN / ED COURSE  I reviewed the triage vital signs and the nursing notes.                              Differential diagnosis includes, but is not limited to, dehydration, electrolyte abnormality, sepsis, pyelonephritis, stone, UTI, medication side effect  Patient presented to the ER for evaluation of symptoms as described above  Patient clinically stable afebrile.  Blood work sent for above differential.  Does appear mildly dehydrated.  We will give IV fluids as well as check urine.   Clinical Course as of 07/29/22  1810  Fri Jul 29, 2022  1626 Urine does not show any evidence of infection.  Does appear mildly dehydrated will continue with IV fluids. [PR]  1808 Reassessed.  She feels significantly improved after IV fluids.  She is tolerating p.o. able to ambulate steady gait.  Symptoms resolved.  Urinalysis does not appear infected no leukocytosis.  This point I do believe she stable appropriate for outpatient follow-up. [PR]    Clinical Course User Index [PR] Willy Eddy, MD    FINAL CLINICAL IMPRESSION(S) / ED DIAGNOSES   Final diagnoses:  Dehydration     Rx / DC Orders   ED Discharge Orders     None        Note:  This document was prepared using Dragon voice recognition software and may include unintentional dictation errors.    Willy Eddy, MD 07/29/22 1810

## 2022-07-29 NOTE — ED Triage Notes (Addendum)
Pt to ED via POV from home. Pt reports painful urination, confusion, tremors, nausea, HA, body aches that have been ongoing for 2 weeks. Pt states she has recurrent UTIs and currently on antibiotics. Pt also reports upcoming surgery for kidney stone removal.

## 2022-07-29 NOTE — Telephone Encounter (Signed)
Patient called Triage to report symptoms of headache, body aches, and feeling dehydrated with some minimal pain. Per Carman Ching, PA go to ER. Patient voiced understanding.

## 2022-08-01 ENCOUNTER — Telehealth: Payer: Self-pay

## 2022-08-01 MED ORDER — CEFAZOLIN SODIUM-DEXTROSE 1-4 GM/50ML-% IV SOLN
1.0000 g | INTRAVENOUS | Status: AC
  Administered 2022-08-02: 2 g via INTRAVENOUS

## 2022-08-01 MED ORDER — CHLORHEXIDINE GLUCONATE 0.12 % MT SOLN: 15.0000 mL | Freq: Once | OROMUCOSAL | Status: AC

## 2022-08-01 MED ORDER — LACTATED RINGERS IV SOLN: INTRAVENOUS | Status: DC

## 2022-08-01 MED ORDER — ORAL CARE MOUTH RINSE: 15.0000 mL | Freq: Once | OROMUCOSAL | Status: AC

## 2022-08-01 NOTE — Progress Notes (Signed)
[facility] Posting Form   Surgery Date/Time: Date: 08/02/2022  Surgeon: Dr. Irineo Axon, MD  Surgery Location: Day Surgery  Inpt ( No  )   Outpt (Yes)   Obs ( No  )   Diagnosis: N20.1 Right Ureteral Stone  -CPT: [phone]  Surgery: Right Ureteroscopy with laser lithotripsy, and Stent Placement  Stop Anticoagulations: N/A  Cardiac/Medical/Pulmonary Clearance needed: no  *Orders entered into EPIC  Date: 08/01/22   *Case booked in EPIC  Date: 08/01/22  *Notified pt of Surgery: Date: 08/01/22  PRE-OP UA & CX: no  *Placed into Prior Authorization Work Que Date: 08/01/22   Assistant/laser/rep:No

## 2022-08-01 NOTE — Telephone Encounter (Signed)
I spoke with Kara Garza. We have discussed possible surgery dates and Tuesday August 15th, 2023 was agreed upon by all parties. Patient given information about surgery date, what to expect pre-operatively and post operatively.  We discussed that a Pre-Admission Testing office will be calling to set up the pre-op visit that will take place prior to surgery, and that these appointments are typically done over the phone with a Pre-Admissions RN.  Informed patient that our office will communicate any additional care to be provided after surgery. Patients questions or concerns were discussed during our call. Advised to call our office should there be any additional information, questions or concerns that arise. Patient verbalized understanding.

## 2022-08-02 ENCOUNTER — Encounter
Admission: RE | Disposition: A | Discharge status: Home / Self Care | Payer: Self-pay | Source: Home / Self Care | Attending: Urology

## 2022-08-02 ENCOUNTER — Ambulatory Visit: Payer: Medicare Other

## 2022-08-02 ENCOUNTER — Encounter: Payer: Self-pay | Admitting: Urology

## 2022-08-02 ENCOUNTER — Other Ambulatory Visit: Payer: Self-pay

## 2022-08-02 ENCOUNTER — Ambulatory Visit: Payer: Medicare Other | Admitting: Certified Registered Nurse Anesthetist

## 2022-08-02 ENCOUNTER — Ambulatory Visit
Admission: RE | Admit: 2022-08-02 | Discharge: 2022-08-02 | Disposition: A | Discharge status: Home / Self Care | Payer: Medicare Other | Attending: Urology | Admitting: Urology

## 2022-08-02 DIAGNOSIS — I10 Essential (primary) hypertension: Secondary | ICD-10-CM | POA: Diagnosis not present

## 2022-08-02 DIAGNOSIS — N3946 Mixed incontinence: Secondary | ICD-10-CM | POA: Insufficient documentation

## 2022-08-02 DIAGNOSIS — N201 Calculus of ureter: Secondary | ICD-10-CM | POA: Diagnosis present

## 2022-08-02 DIAGNOSIS — F32A Depression, unspecified: Secondary | ICD-10-CM | POA: Diagnosis not present

## 2022-08-02 DIAGNOSIS — Z79899 Other long term (current) drug therapy: Secondary | ICD-10-CM | POA: Diagnosis not present

## 2022-08-02 DIAGNOSIS — F17210 Nicotine dependence, cigarettes, uncomplicated: Secondary | ICD-10-CM | POA: Insufficient documentation

## 2022-08-02 DIAGNOSIS — Z87440 Personal history of urinary (tract) infections: Secondary | ICD-10-CM | POA: Insufficient documentation

## 2022-08-02 HISTORY — PX: CYSTOSCOPY/URETEROSCOPY/HOLMIUM LASER/STENT PLACEMENT: SHX6546

## 2022-08-02 SURGERY — CYSTOSCOPY/URETEROSCOPY/HOLMIUM LASER/STENT PLACEMENT
Anesthesia: General | Site: Ureter | Laterality: Right

## 2022-08-02 MED ORDER — SODIUM CHLORIDE 0.9 % IR SOLN
Status: DC | PRN
  Administered 2022-08-02: 3000 mL via INTRAVESICAL

## 2022-08-02 MED ORDER — IOHEXOL 180 MG/ML  SOLN
INTRAMUSCULAR | Status: DC | PRN
  Administered 2022-08-02: 10 mL

## 2022-08-02 MED ORDER — LIDOCAINE HCL (CARDIAC) PF 100 MG/5ML IV SOSY
PREFILLED_SYRINGE | INTRAVENOUS | Status: DC | PRN
  Administered 2022-08-02: 80 mg via INTRAVENOUS

## 2022-08-02 MED ORDER — OXYCODONE HCL 5 MG PO TABS: 5.0000 mg | ORAL_TABLET | Freq: Once | ORAL | Status: DC | PRN

## 2022-08-02 MED ORDER — FENTANYL CITRATE (PF) 100 MCG/2ML IJ SOLN
INTRAMUSCULAR | Status: AC
  Filled 2022-08-02: qty 2

## 2022-08-02 MED ORDER — FENTANYL CITRATE (PF) 100 MCG/2ML IJ SOLN: 25.0000 ug | INTRAMUSCULAR | Status: DC | PRN

## 2022-08-02 MED ORDER — ACETAMINOPHEN 10 MG/ML IV SOLN
INTRAVENOUS | Status: AC
  Filled 2022-08-02: qty 100

## 2022-08-02 MED ORDER — CHLORHEXIDINE GLUCONATE 0.12 % MT SOLN
OROMUCOSAL | Status: AC
  Administered 2022-08-02: 15 mL via OROMUCOSAL
  Filled 2022-08-02: qty 15

## 2022-08-02 MED ORDER — EPHEDRINE SULFATE (PRESSORS) 50 MG/ML IJ SOLN
INTRAMUSCULAR | Status: DC | PRN
  Administered 2022-08-02 (×2): 5 mg via INTRAVENOUS

## 2022-08-02 MED ORDER — HYDROCODONE-ACETAMINOPHEN 5-325 MG PO TABS: 1.0000 | ORAL_TABLET | ORAL | 0 refills | Status: DC | PRN

## 2022-08-02 MED ORDER — ONDANSETRON HCL 4 MG/2ML IJ SOLN: 4.0000 mg | Freq: Once | INTRAMUSCULAR | Status: DC | PRN

## 2022-08-02 MED ORDER — LACTATED RINGERS IV SOLN
INTRAVENOUS | Status: DC
Start: 2022-08-02 — End: 2022-08-02

## 2022-08-02 MED ORDER — PROPOFOL 10 MG/ML IV BOLUS
INTRAVENOUS | Status: AC
  Filled 2022-08-02: qty 20

## 2022-08-02 MED ORDER — DEXAMETHASONE SODIUM PHOSPHATE 10 MG/ML IJ SOLN
INTRAMUSCULAR | Status: DC | PRN
  Administered 2022-08-02: 10 mg via INTRAVENOUS

## 2022-08-02 MED ORDER — ACETAMINOPHEN 10 MG/ML IV SOLN
INTRAVENOUS | Status: DC | PRN
  Administered 2022-08-02: 1000 mg via INTRAVENOUS

## 2022-08-02 MED ORDER — OXYCODONE HCL 5 MG/5ML PO SOLN: 5.0000 mg | Freq: Once | ORAL | Status: DC | PRN

## 2022-08-02 MED ORDER — PHENYLEPHRINE 80 MCG/ML (10ML) SYRINGE FOR IV PUSH (FOR BLOOD PRESSURE SUPPORT)
PREFILLED_SYRINGE | INTRAVENOUS | Status: DC | PRN
  Administered 2022-08-02 (×2): 80 ug via INTRAVENOUS
  Administered 2022-08-02: 160 ug via INTRAVENOUS
  Administered 2022-08-02 (×2): 80 ug via INTRAVENOUS

## 2022-08-02 MED ORDER — ACETAMINOPHEN 10 MG/ML IV SOLN: 1000.0000 mg | Freq: Once | INTRAVENOUS | Status: DC | PRN

## 2022-08-02 MED ORDER — FENTANYL CITRATE (PF) 100 MCG/2ML IJ SOLN
INTRAMUSCULAR | Status: DC | PRN
  Administered 2022-08-02 (×2): 25 ug via INTRAVENOUS
  Administered 2022-08-02: 50 ug via INTRAVENOUS

## 2022-08-02 MED ORDER — CEFAZOLIN SODIUM-DEXTROSE 1-4 GM/50ML-% IV SOLN
INTRAVENOUS | Status: AC
  Filled 2022-08-02: qty 50

## 2022-08-02 MED ORDER — PROPOFOL 10 MG/ML IV BOLUS
INTRAVENOUS | Status: DC | PRN
  Administered 2022-08-02: 120 mg via INTRAVENOUS

## 2022-08-02 MED ORDER — ONDANSETRON HCL 4 MG/2ML IJ SOLN
INTRAMUSCULAR | Status: DC | PRN
  Administered 2022-08-02: 4 mg via INTRAVENOUS

## 2022-08-02 SURGICAL SUPPLY — 29 items
BAG DRAIN SIEMENS DORNER NS (MISCELLANEOUS) ×2 IMPLANT
BAG DRN NS LF (MISCELLANEOUS) ×1
BASKET ZERO TIP 1.9FR (BASKET) ×1 IMPLANT
BRUSH SCRUB EZ 1% IODOPHOR (MISCELLANEOUS) ×2 IMPLANT
BSKT STON RTRVL ZERO TP 1.9FR (BASKET) ×1
CATH URET FLEX-TIP 2 LUMEN 10F (CATHETERS) IMPLANT
CATH URETL OPEN END 6X70 (CATHETERS) IMPLANT
CNTNR SPEC 2.5X3XGRAD LEK (MISCELLANEOUS) ×2
CONT SPEC 4OZ STER OR WHT (MISCELLANEOUS) ×4
CONT SPEC 4OZ STRL OR WHT (MISCELLANEOUS) ×2
CONTAINER SPEC 2.5X3XGRAD LEK (MISCELLANEOUS) IMPLANT
DRAPE UTILITY 15X26 TOWEL STRL (DRAPES) ×2 IMPLANT
GLOVE SURG UNDER POLY LF SZ7.5 (GLOVE) ×2 IMPLANT
GOWN STRL REUS W/ TWL LRG LVL3 (GOWN DISPOSABLE) ×1 IMPLANT
GOWN STRL REUS W/ TWL XL LVL3 (GOWN DISPOSABLE) ×1 IMPLANT
GOWN STRL REUS W/TWL LRG LVL3 (GOWN DISPOSABLE) ×2
GOWN STRL REUS W/TWL XL LVL3 (GOWN DISPOSABLE) ×2
GUIDEWIRE STR DUAL SENSOR (WIRE) ×2 IMPLANT
IV NS IRRIG 3000ML ARTHROMATIC (IV SOLUTION) ×2 IMPLANT
KIT TURNOVER CYSTO (KITS) ×2 IMPLANT
PACK CYSTO AR (MISCELLANEOUS) ×2 IMPLANT
SET CYSTO W/LG BORE CLAMP LF (SET/KITS/TRAYS/PACK) ×2 IMPLANT
SHEATH NAVIGATOR HD 12/14X36 (SHEATH) IMPLANT
STENT URET 6FRX24 CONTOUR (STENTS) ×1 IMPLANT
STENT URET 6FRX26 CONTOUR (STENTS) IMPLANT
SURGILUBE 2OZ TUBE FLIPTOP (MISCELLANEOUS) ×2 IMPLANT
TRACTIP FLEXIVA PULSE ID 200 (Laser) ×2 IMPLANT
VALVE UROSEAL ADJ ENDO (VALVE) IMPLANT
WATER STERILE IRR 500ML POUR (IV SOLUTION) ×2 IMPLANT

## 2022-08-02 NOTE — Op Note (Signed)
Preoperative diagnosis: Right distal ureteral calculus  Postoperative diagnosis: Right distal ureteral calculus  Procedure:  Cystoscopy Right ureteroscopy and stone removal Ureteroscopic laser lithotripsy Right ureteral stent placement (39F/24 cm)  Right retrograde pyelography with interpretation Intraoperative fluoroscopy < 30 minutes  Surgeon: Lorin Picket C. Jnai Snellgrove, M.D.  Anesthesia: General  Complications: None  Intraoperative findings:  Cystoscopy-bladder mucosa normal in appearance without erythema, solid or papillary lesions.  UOs normal-appearing bilaterally without edema Right ureteroscopy calculus in the distal ureter Right retrograde pyelogram-pullout retrograde pyelogram performed post procedure showed no extravasation and mild right hydronephrosis  EBL: Minimal  Specimens: Calculus fragments for analysis   Indication: Kara Garza is a 76 y.o. with history of frequency, urgency and UTI.  A CT showed a 3 mm right distal ureteral calculus.  She continued to have persistent symptoms.  The calculus was not seen on KUB however she was not aware of passing the stone and elected to proceed with right ureteroscopy. After reviewing the management options for treatment, the patient elected to proceed with the above surgical procedure(s). We have discussed the potential benefits and risks of the procedure, side effects of the proposed treatment, the likelihood of the patient achieving the goals of the procedure, and any potential problems that might occur during the procedure or recuperation. Informed consent has been obtained.  Description of procedure:  The patient was taken to the operating room and general anesthesia was induced.  The patient was placed in the dorsal lithotomy position, prepped and draped in the usual sterile fashion, and preoperative antibiotics were administered. A preoperative time-out was performed.   A 21 French cystoscope was lubricated and inserted into the  bladder without difficulty.  Panendoscopy was performed with findings as described above.  Attention was directed to the right ureteral orifice and a 0.038 Sensor wire was then advanced up the  ureter into the renal pelvis under fluoroscopic guidance.  A 4.5 Fr semirigid ureteroscope was then advanced into the ureter next to the guidewire and the calculus was identified as described above.  The ureter was fairly small caliber and a 1.9 French nitinol basket was unable to be advanced beyond the stone basketing.  The basket was then removed and a 242 m holmium was placed through the ureteroscope and the stone was fragmented at a setting of 0.8 J / 10 Hz.  All fragments were then removed from the ureter with a zero tip nitinol basket.  Reinspection of the ureter revealed no remaining visible stones or fragments.   Retrograde pyelogram was performed with findings as described above.  A 6R/24 cm Contour ureteral was placed under fluoroscopic guidance.  The wire was then removed with an adequate stent curl noted in the renal pelvis as well as in the bladder.  The bladder was then emptied and the procedure ended.  The patient appeared to tolerate the procedure well and without complications.  After anesthetic reversal the patient was transported to the PACU in stable condition.   Plan: The stent was left attached to a tether which was cut short and tucked into the vagina.  She was instructed to remove the stent on Thursday, 08/03/2022 A follow-up appointment will be scheduled in approximately 1 month   Irineo Axon, MD

## 2022-08-02 NOTE — Discharge Instructions (Addendum)
DISCHARGE INSTRUCTIONS FOR KIDNEY STONE/URETERAL STENT   MEDICATIONS:  1. Resume all your other meds from home.  2.  AZO (over-the-counter) can help with the burning/stinging when you urinate. 3.  Hydrocodone is for moderate/severe pain, Rx was sent to your pharmacy. 4.  Continue tamsulosin and hyoscyamine for bladder irritation  ACTIVITY:  1. May resume regular activities in 24 hours. 2. No driving while on narcotic pain medications  3. Drink plenty of water  4. Continue to walk at home - you can still get blood clots when you are at home, so keep active, but don't over do it.  5. May return to work/school tomorrow or when you feel ready   BATHING:  1. You can shower. 2. You have a string coming from your urethra: The stent string is attached to your ureteral stent. Do not pull on this.   SIGNS/SYMPTOMS TO CALL:  Common postoperative symptoms include urinary frequency, urgency, bladder spasm and blood in the urine  Please call us if you have a fever greater than 101.5, uncontrolled nausea/vomiting, uncontrolled pain, dizziness, unable to urinate, excessively bloody urine, chest pain, shortness of breath, leg swelling, leg pain, or any other concerns or questions.   You can reach [place] at [phone].   FOLLOW-UP:  1. You we will be contacted for a follow-up appointment 2. You have a string attached to your stent, you may remove it on Thursday, 08/04/2022. To do this, pull the string until the stent is completely removed. You may feel an odd sensation in your back. AMBULATORY SURGERY  DISCHARGE INSTRUCTIONS   The drugs that you were given will stay in your system until tomorrow so for the next 24 hours you should not:  Drive an automobile Make any legal decisions Drink any alcoholic beverage   You may resume regular meals tomorrow.  Today it is better to start with liquids and gradually work up to solid foods.  You may eat anything you prefer, but it is better to start with  liquids, then soup and crackers, and gradually work up to solid foods.   Please notify your doctor immediately if you have any unusual bleeding, trouble breathing, redness and pain at the surgery site, drainage, fever, or pain not relieved by medication.    Additional Instructions:        Please contact your physician with any problems or Same Day Surgery at [phone], Monday through Friday 6 am to 4 pm, or [place] at [facility] number at [phone].

## 2022-08-02 NOTE — Anesthesia Preprocedure Evaluation (Addendum)
Anesthesia Evaluation  Patient identified by MRN, date of birth, ID band Patient awake    Reviewed: Allergy & Precautions, NPO status , Patient's Chart, lab work & pertinent test results  History of Anesthesia Complications (+) PONV and history of anesthetic complications  Airway Mallampati: IV   Neck ROM: Full    Dental  (+) Missing   Pulmonary asthma , COPD, Current Smoker (1 ppd)Patient did not abstain from smoking.,    Pulmonary exam normal breath sounds clear to auscultation       Cardiovascular hypertension, Normal cardiovascular exam Rhythm:Regular Rate:Normal  ECG 07/29/22:  Sinus tachycardia Right atrial enlargement Borderline ECG When compared with ECG of 06-Feb-2020 15:12, No significant change was found   Neuro/Psych PSYCHIATRIC DISORDERS Anxiety Depression negative neurological ROS     GI/Hepatic negative GI ROS,   Endo/Other  negative endocrine ROS  Renal/GU Renal disease (nephrolithiasis)     Musculoskeletal   Abdominal   Peds  Hematology negative hematology ROS (+)   Anesthesia Other Findings   Reproductive/Obstetrics                            Anesthesia Physical Anesthesia Plan  ASA: 2  Anesthesia Plan: General   Post-op Pain Management:    Induction: Intravenous  PONV Risk Score and Plan: 3 and Ondansetron, Dexamethasone and Treatment may vary due to age or medical condition  Airway Management Planned: LMA  Additional Equipment:   Intra-op Plan:   Post-operative Plan: Extubation in OR  Informed Consent: I have reviewed the patients History and Physical, chart, labs and discussed the procedure including the risks, benefits and alternatives for the proposed anesthesia with the patient or authorized representative who has indicated his/her understanding and acceptance.     Dental advisory given  Plan Discussed with: CRNA  Anesthesia Plan Comments:  (Patient consented for risks of anesthesia including but not limited to:  - adverse reactions to medications - damage to eyes, teeth, lips or other oral mucosa - nerve damage due to positioning  - sore throat or hoarseness - damage to heart, brain, nerves, lungs, other parts of body or loss of life  Informed patient about role of CRNA in peri- and intra-operative care.  Patient voiced understanding.)        Anesthesia Quick Evaluation

## 2022-08-02 NOTE — Anesthesia Postprocedure Evaluation (Signed)
Anesthesia Post Note  Patient: Kara Garza  Procedure(s) Performed: CYSTOSCOPY/URETEROSCOPY/HOLMIUM LASER/STENT PLACEMENT (Right: Ureter)  Patient location during evaluation: PACU Anesthesia Type: General Level of consciousness: awake and alert, oriented and patient cooperative Pain management: pain level controlled Vital Signs Assessment: post-procedure vital signs reviewed and stable Respiratory status: spontaneous breathing, nonlabored ventilation and respiratory function stable Cardiovascular status: blood pressure returned to baseline and stable Postop Assessment: adequate PO intake Anesthetic complications: no   No notable events documented.   Last Vitals:  Vitals:   08/02/22 1115 08/02/22 1130  BP: (!) 115/40 (!) 105/41  Pulse: 76 73  Resp: 13 12  Temp:  36.5 C  SpO2: 100% 98%    Last Pain:  Vitals:   08/02/22 1130  TempSrc:   PainSc: 0-No pain                 Reed Breech

## 2022-08-02 NOTE — Anesthesia Procedure Notes (Signed)
Procedure Name: LMA Insertion Date/Time: 08/02/2022 10:21 AM  Performed by: Joanette Gula, Jomarie Gellis, CRNAPre-anesthesia Checklist: Patient identified, Emergency Drugs available, Suction available and Patient being monitored Patient Re-evaluated:Patient Re-evaluated prior to induction Oxygen Delivery Method: Circle system utilized Preoxygenation: Pre-oxygenation with 100% oxygen Induction Type: IV induction Ventilation: Mask ventilation without difficulty LMA: LMA inserted LMA Size: 3.0 Number of attempts: 1 Placement Confirmation: positive ETCO2 and breath sounds checked- equal and bilateral Tube secured with: Tape Dental Injury: Teeth and Oropharynx as per pre-operative assessment

## 2022-08-02 NOTE — Interval H&P Note (Signed)
History and Physical Interval Note:  She is still symptomatic and not aware of passing a stone.  Desires to proceed with ureteroscopy.  All questions were answered  CV: RRR Lungs: Clear  08/02/2022 10:07 AM  Kara Garza  has presented today for surgery, with the diagnosis of Right Ureteral Stone.  The various methods of treatment have been discussed with the patient and family. After consideration of risks, benefits and other options for treatment, the patient has consented to  Procedure(s): CYSTOSCOPY/URETEROSCOPY/HOLMIUM LASER/STENT PLACEMENT (Right) as a surgical intervention.  The patient's history has been reviewed, patient examined, no change in status, stable for surgery.  I have reviewed the patient's chart and labs.  Questions were answered to the patient's satisfaction.     [name] C [name]

## 2022-08-02 NOTE — Transfer of Care (Signed)
Immediate Anesthesia Transfer of Care Note  Patient: Kara Garza  Procedure(s) Performed: CYSTOSCOPY/URETEROSCOPY/HOLMIUM LASER/STENT PLACEMENT (Right: Ureter)  Patient Location: PACU  Anesthesia Type:General  Level of Consciousness: drowsy  Airway & Oxygen Therapy: Patient Spontanous Breathing and Patient connected to face mask oxygen  Post-op Assessment: Report given to RN and Post -op Vital signs reviewed and stable  Post vital signs: Reviewed and stable  Last Vitals:  Vitals Value Taken Time  BP 104/44   Temp    Pulse 78 08/02/22 1057  Resp 13   SpO2 100 % 08/02/22 1057  Vitals shown include unvalidated device data.  Last Pain:  Vitals:   08/02/22 0904  TempSrc: Temporal  PainSc: 0-No pain         Complications: No notable events documented.

## 2022-08-03 ENCOUNTER — Encounter: Payer: Self-pay | Admitting: Urology

## 2022-08-05 ENCOUNTER — Encounter: Payer: Self-pay | Admitting: Urology

## 2022-08-09 LAB — CALCULI, WITH PHOTOGRAPH (CLINICAL LAB)
Calcium Oxalate Dihydrate: 20 %
Calcium Oxalate Monohydrate: 80 %
Weight Calculi: 18 mg

## 2022-09-02 ENCOUNTER — Encounter: Payer: Self-pay | Admitting: Urology

## 2022-09-02 ENCOUNTER — Ambulatory Visit (INDEPENDENT_AMBULATORY_CARE_PROVIDER_SITE_OTHER): Payer: Medicare Other | Admitting: Urology

## 2022-09-02 VITALS — BP 145/67 | HR 90 | Ht 65.0 in | Wt 160.0 lb

## 2022-09-02 DIAGNOSIS — Z09 Encounter for follow-up examination after completed treatment for conditions other than malignant neoplasm: Secondary | ICD-10-CM

## 2022-09-02 NOTE — Progress Notes (Signed)
09/02/2022 9:21 AM   Kara Garza 1946-08-30 [id number]  Referring provider: Dorothey Baseman, MD [id number] [place],  [place] [address]  Chief Complaint  Patient presents with   Follow-up    HPI: 76 y.o. female presents for postop follow-up.  Ureteroscopic removal of a right distal ureteral calculus 08/02/2022 Stent left on tether and removed 2 days postop She did experience right flank pain ~ 2 hours after stent removal which resolved within a day No complaints and feels well Stone analysis CaOxMono/CaOxDi 80/20   PMH: Past Medical History:  Diagnosis Date   Anemia    H/O   Anxiety    Asthma    PT DENIES BUT PCP HAS LISTED ON H&P   Chronic kidney disease    H/O KIDNEY STONES   Complication of anesthesia    COPD (chronic obstructive pulmonary disease) (HCC)    PT DENIES BUT PCP HAS LISTED ON H&P   Depression    Hypertension    PONV (postoperative nausea and vomiting)    NAUSEATED    Surgical History: Past Surgical History:  Procedure Laterality Date   BREAST BIOPSY Right yrs ago   benign   CYSTOSCOPY MACROPLASTIQUE IMPLANT N/A 04/12/2016   Procedure: CYSTOSCOPY MACROPLASTIQUE IMPLANT;  Surgeon: Orson Ape, MD;  Location: [facility] ORS;  Service: Urology;  Laterality: N/A;   CYSTOSCOPY/URETEROSCOPY/HOLMIUM LASER/STENT PLACEMENT Right 08/02/2022   Procedure: CYSTOSCOPY/URETEROSCOPY/HOLMIUM LASER/STENT PLACEMENT;  Surgeon: Riki Altes, MD;  Location: [facility] ORS;  Service: Urology;  Laterality: Right;   HAMMER TOE SURGERY     HERNIA REPAIR     TONSILLECTOMY     TUBAL LIGATION      Home Medications:  Allergies as of 09/02/2022       Reactions   Amoxicillin Diarrhea   Amoxicillin-pot Clavulanate Other (See Comments)   Causes GI issues   Ivp Dye [iodinated Contrast Media] Rash        Medication List        Accurate as of September 02, 2022  9:21 AM. If you have any questions, ask your nurse or doctor.          STOP taking these medications     HYDROcodone-acetaminophen 5-325 MG tablet Commonly known as: NORCO/VICODIN Stopped by: Riki Altes, MD   Hyoscyamine Sulfate SL 0.125 MG Subl Commonly known as: Levsin/SL Stopped by: Riki Altes, MD   tamsulosin 0.4 MG Caps capsule Commonly known as: FLOMAX Stopped by: Riki Altes, MD       TAKE these medications    Cholecalciferol 25 MCG (1000 UT) tablet Take by mouth.   COSENTYX [place] Inject into the skin.   lisinopril-hydrochlorothiazide 20-12.5 MG tablet Commonly known as: ZESTORETIC Take 1 tablet by mouth daily.   rOPINIRole 0.25 MG tablet Commonly known as: REQUIP Take by mouth.   venlafaxine XR 37.5 MG 24 hr capsule Commonly known as: EFFEXOR-XR Take 37.5 mg by mouth daily.        Allergies:  Allergies  Allergen Reactions   Amoxicillin Diarrhea   Amoxicillin-Pot Clavulanate Other (See Comments)    Causes GI issues   Ivp Dye [Iodinated Contrast Media] Rash    Family History: Family History  Problem Relation Age of Onset   Breast cancer Mother 41    Social History:  reports that she has been smoking cigarettes. She has a 25.00 pack-year smoking history. She has never used smokeless tobacco. She reports current alcohol use. She reports that she does not use drugs.  Physical Exam: BP (!) 145/67   Pulse 90   Ht 5\' 5"  (1.651 m)   Wt 160 lb (72.6 kg)   BMI 26.63 kg/m   Constitutional:  Alert and oriented, No acute distress. HEENT: [place] AT Respiratory: Normal respiratory effort, no increased work of breathing. Psychiatric: Normal mood and affect.   Assessment & Plan:   Doing well status post ureteroscopic stone removal Follow-up 1 year with KUB Return as needed for recurrent UTI symptoms We discussed metabolic stone evaluation however she declined and elected general stone prevention guidelines.  She was provided literature   , MD  [facility] [address], [address] [place],  [place] [phone]

## 2022-10-21 ENCOUNTER — Ambulatory Visit: Payer: Medicare Other | Admitting: Urology

## 2022-11-22 IMAGING — MG MM DIGITAL SCREENING BILAT W/ TOMO AND CAD
8 series · 8 of 24 positions shown · non-contrast
Comparison: Previous exam(s).

CLINICAL DATA: Screening.

EXAM:
DIGITAL SCREENING BILATERAL MAMMOGRAM WITH TOMOSYNTHESIS AND CAD
TECHNIQUE: Bilateral screening digital craniocaudal and mediolateral oblique
mammograms were obtained. Bilateral screening digital breast
tomosynthesis was performed. The images were evaluated with
computer-aided detection.

[L MLO synth-2D]
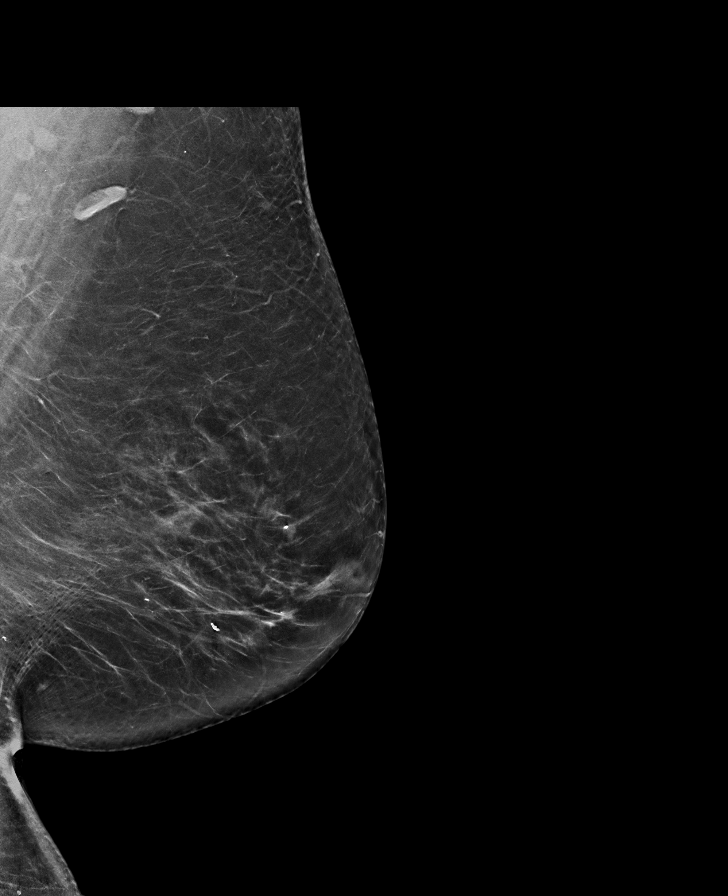

[R CC synth-2D]
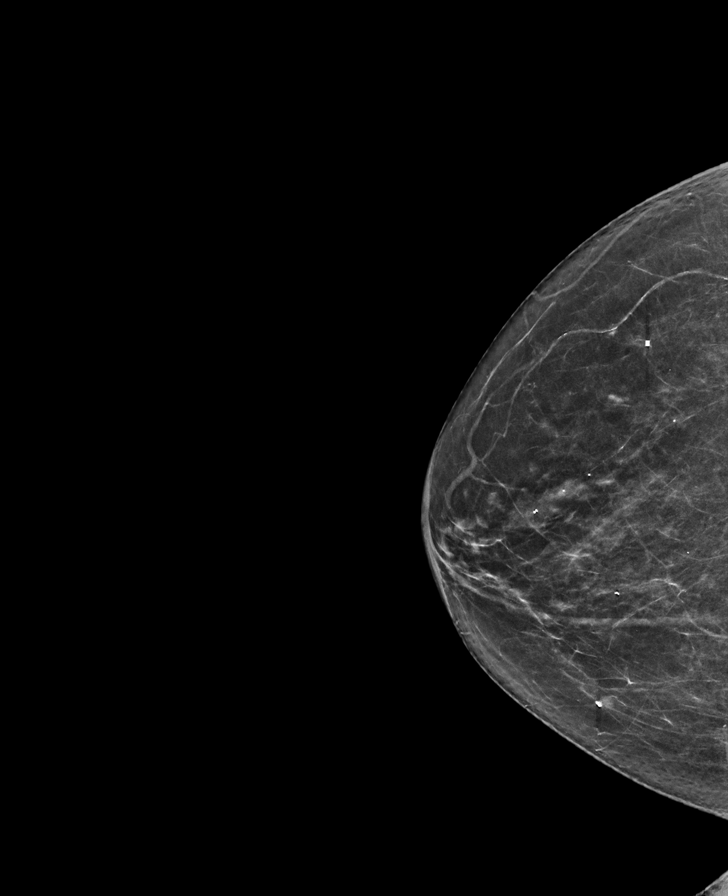

[R MLO synth-2D]
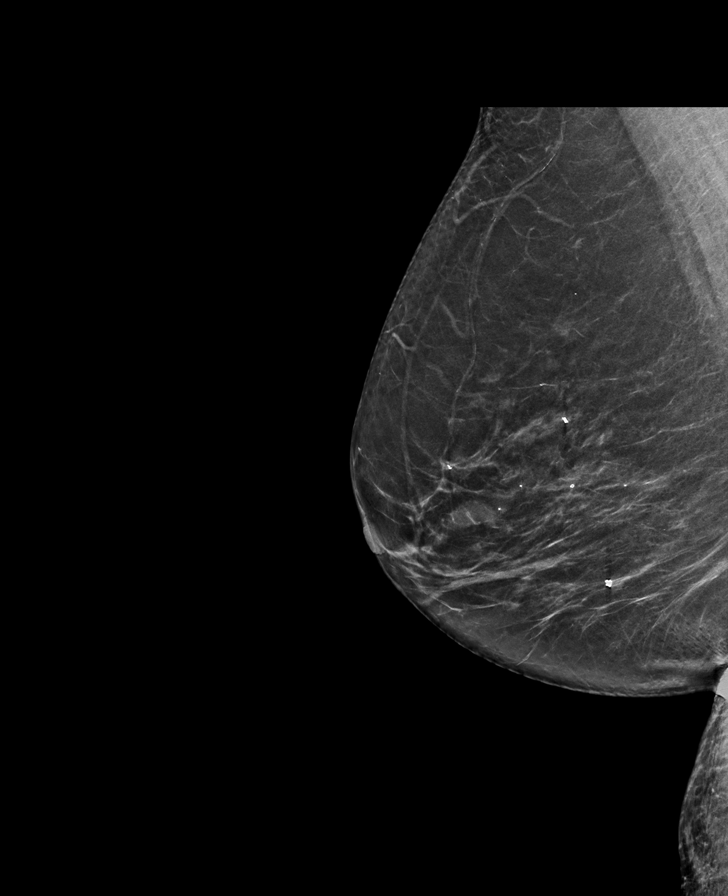

[L CC synth-2D]
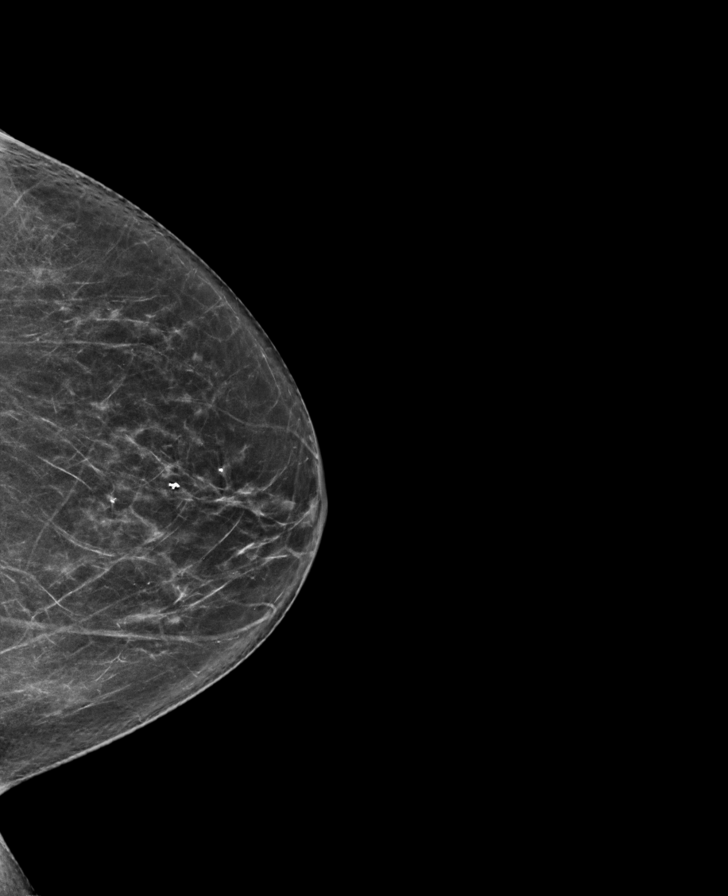

[R CC tomo · tomo slice 30/59.0]
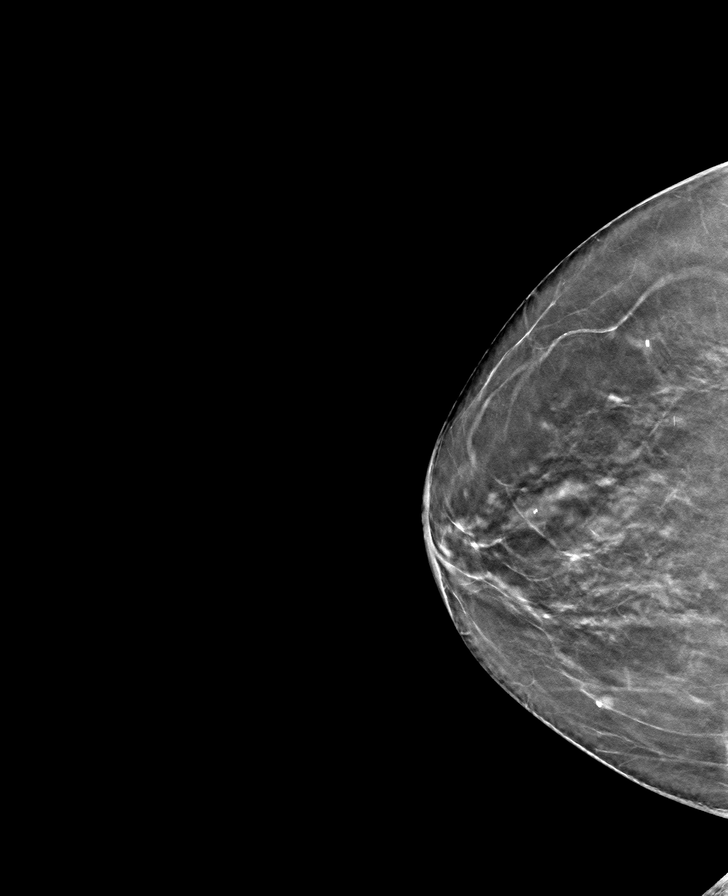

[R MLO tomo · tomo slice 33/66.0]
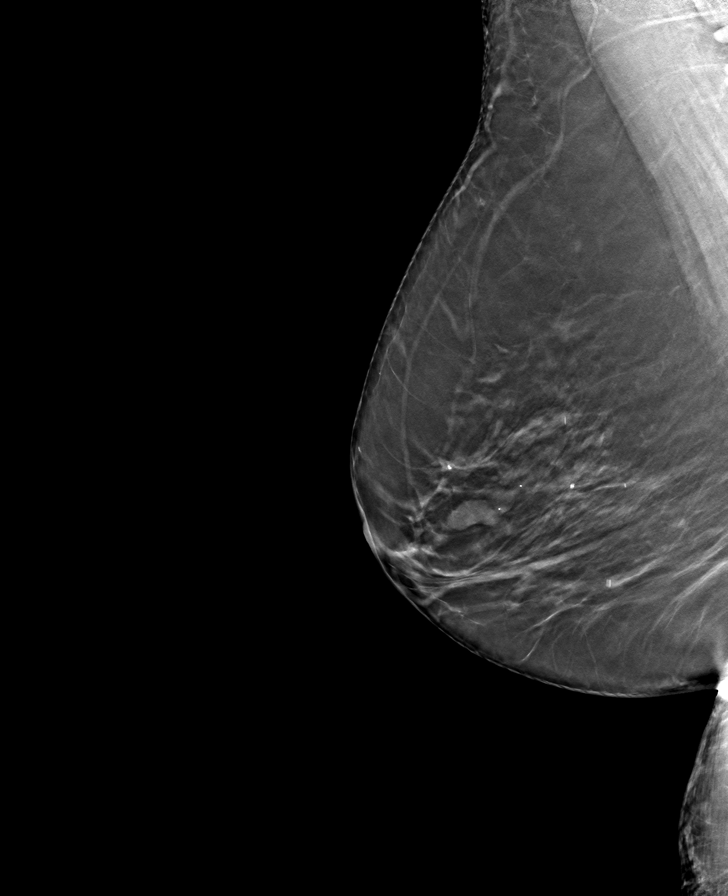

[L CC tomo · tomo slice 33/65.0]
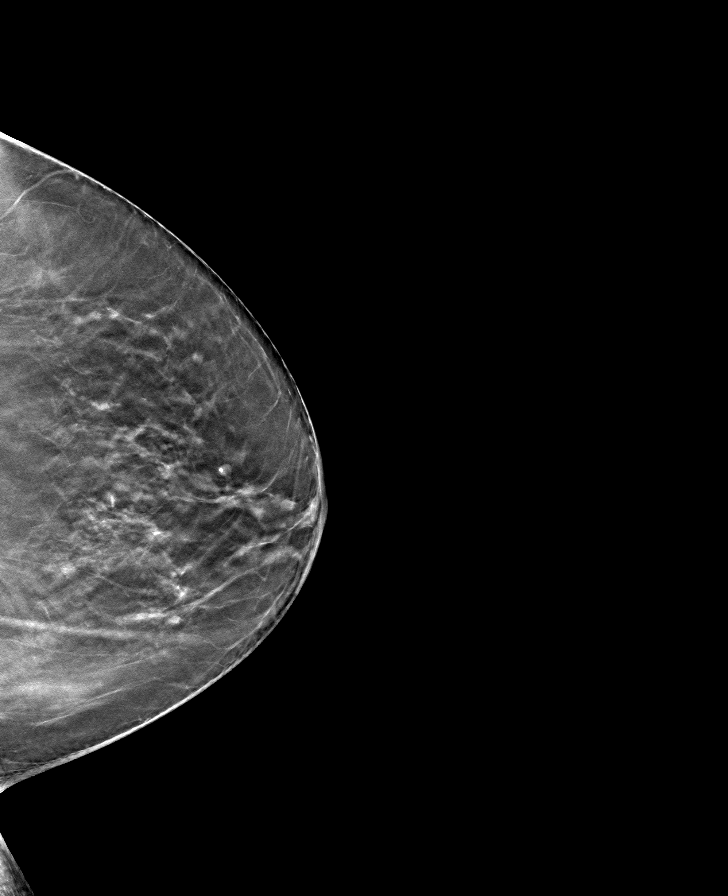

[L MLO tomo · tomo slice 39/77.0]
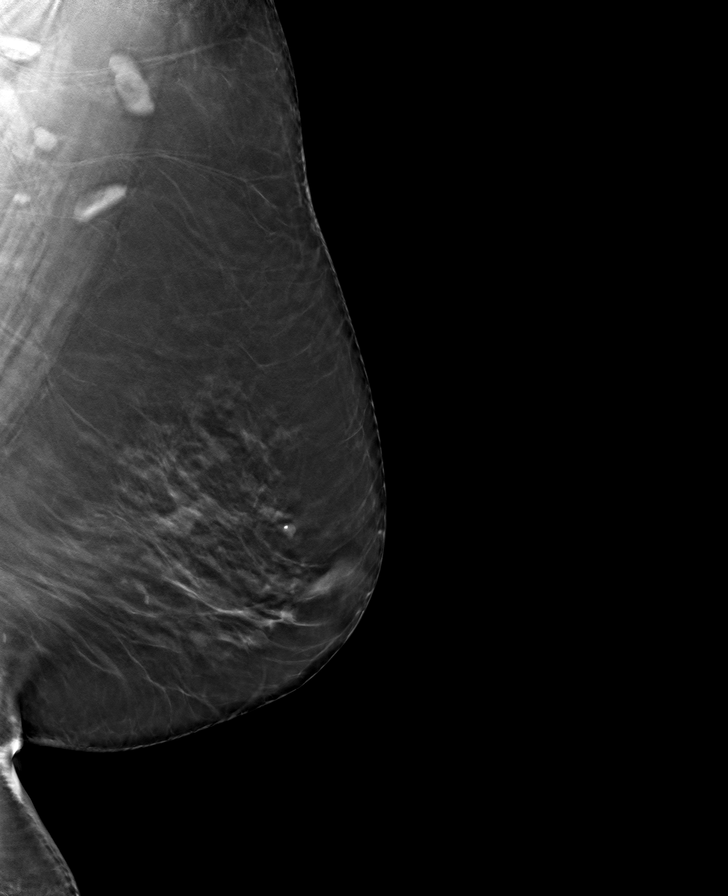

[8 of 24 positions shown; findings below may reference images not displayed]

ACR Breast Density Category b: There are scattered areas of
fibroglandular density.
FINDINGS: There are no findings suspicious for malignancy.
IMPRESSION: No mammographic evidence of malignancy. A result letter of this
screening mammogram will be mailed directly to the patient.

RECOMMENDATION:
Screening mammogram in one year. (Code:51-O-LD2)

BI-RADS CATEGORY  1: Negative.

## 2023-01-19 NOTE — Progress Notes (Signed)
01/20/2023 8:46 AM   Kara Garza [birth year] [id number]  Referring provider: Juluis Pitch, MD [phone] [address] [place],  [place] [address]  Urological history: 1. Mixed incontinence -contributing factors of age, vaginal atrophy, anxiety, smoker, hyperglycemia, COPD and depression -Macroplastique 2017  2. rUTI's -contributing factors of age and vaginal atrophy -cysto 2021 NED -Documented urine cultures over the last year  July 26, 2022 E. Coli  July 20th 2023 Klebsiella pneumoniae  3. Bilateral nephrolithiasis - Stone composition of calcium oxalate monohydrate 80% calcium oxalate dihydrate 20% - Right URS (07/2022)    Chief Complaint  Patient presents with   Dysuria    HPI: Kara Garza is a 77 y.o. female who presents today for severe pain and burning with urination.  On Tuesday, she started to experience malodorous urine.  Two days later, she started having dysuria.  Patient denies any modifying or aggravating factors.  Patient denies any gross hematuria, dysuria or suprapubic/flank pain.  Patient denies any fevers, chills, nausea or vomiting.    She does not feel this is due to a stone.  She had some Macrobid on hand and she did start taking those on Tuesday.  UA yellow slightly cloudy, specific gravity 1.025, 1+ blood, pH 5.5, protein 2+, leukocyte trace, greater than 30 WBCs, 3-10 RBCs, greater than 10 epithelial cells and moderate bacteria.  PMH: Past Medical History:  Diagnosis Date   Anemia    H/O   Anxiety    Asthma    PT DENIES BUT PCP HAS LISTED ON H&P   Chronic kidney disease    H/O KIDNEY STONES   Complication of anesthesia    COPD (chronic obstructive pulmonary disease) ([place])    PT DENIES BUT PCP HAS LISTED ON H&P   Depression    Hypertension    PONV (postoperative nausea and vomiting)    NAUSEATED    Surgical History: Past Surgical History:  Procedure Laterality Date   BREAST BIOPSY Right yrs ago   benign   CYSTOSCOPY MACROPLASTIQUE  IMPLANT N/A 04/12/2016   Procedure: CYSTOSCOPY MACROPLASTIQUE IMPLANT;  Surgeon: Royston Cowper, MD;  Location: [facility];  Service: Urology;  Laterality: N/A;   CYSTOSCOPY/URETEROSCOPY/HOLMIUM LASER/STENT PLACEMENT Right 08/02/2022   Procedure: CYSTOSCOPY/URETEROSCOPY/HOLMIUM LASER/STENT PLACEMENT;  Surgeon: Abbie Sons, MD;  Location: [facility];  Service: Urology;  Laterality: Right;   HAMMER TOE SURGERY     HERNIA REPAIR     TONSILLECTOMY     TUBAL LIGATION      Home Medications:  Allergies as of 01/20/2023       Reactions   Amoxicillin Diarrhea   Amoxicillin-pot Clavulanate Other (See Comments)   Causes GI issues   Ivp Dye [iodinated Contrast Media] Rash        Medication List        Accurate as of January 20, 2023  8:46 AM. If you have any questions, ask your nurse or doctor.          Cholecalciferol 25 MCG (1000 UT) tablet Take by mouth.   COSENTYX [place] Inject into the skin.   lisinopril-hydrochlorothiazide 20-12.5 MG tablet Commonly known as: ZESTORETIC Take 1 tablet by mouth daily.   nitrofurantoin (macrocrystal-monohydrate) 100 MG capsule Commonly known as: MACROBID Take 1 capsule (100 mg total) by mouth every 12 (twelve) hours. Started by: Zara Council, PA-C   rOPINIRole 0.25 MG tablet Commonly known as: REQUIP Take by mouth.   venlafaxine XR 37.5 MG 24 hr capsule Commonly known as: EFFEXOR-XR Take  37.5 mg by mouth daily.        Allergies:  Allergies  Allergen Reactions   Amoxicillin Diarrhea   Amoxicillin-Pot Clavulanate Other (See Comments)    Causes GI issues   Ivp Dye [Iodinated Contrast Media] Rash    Family History: Family History  Problem Relation Age of Onset   Breast cancer Mother 43    Social History:  reports that she has been smoking cigarettes. She has a 25.00 pack-year smoking history. She has never used smokeless tobacco. She reports current alcohol use. She reports that she does not use drugs.  ROS: Pertinent ROS  in HPI  Physical Exam: BP 135/75   Pulse 93   Ht 5\' 5"  (1.651 m)   Wt 155 lb (70.3 kg)   BMI 25.79 kg/m   Constitutional:  Well nourished. Alert and oriented, No acute distress. HEENT: [place] AT, moist mucus membranes.  Trachea midline Cardiovascular: No clubbing, cyanosis, or edema. Respiratory: Normal respiratory effort, no increased work of breathing. Neurologic: Grossly intact, no focal deficits, moving all 4 extremities. Psychiatric: Normal mood and affect.    Laboratory Data: Urinalysis See epic and HPI I have reviewed the labs.   Pertinent Imaging: N/A  Assessment & Plan:    1. Dysuria -UA grossly infected -Urine culture pending -Started Macrobid 100 mg twice daily for 7 days, will adjust if necessary once urine culture and sensitivities are available  2. rUTI's -Recommended that she restart her vaginal estrogen cream 3 nights weekly and advised her that it takes up to 8 months to make the physiological changes in the vagina to reduce recurrent UTIs   Return for pending urine culture .  These notes generated with voice recognition software. I apologize for typographical errors.  [place], [place] [address]  [place], [place] [address] [phone]

## 2023-01-20 ENCOUNTER — Encounter: Payer: Self-pay | Admitting: Urology

## 2023-01-20 ENCOUNTER — Ambulatory Visit (INDEPENDENT_AMBULATORY_CARE_PROVIDER_SITE_OTHER): Payer: Medicare Other | Admitting: Urology

## 2023-01-20 VITALS — BP 135/75 | HR 93 | Ht 65.0 in | Wt 155.0 lb

## 2023-01-20 DIAGNOSIS — R300 Dysuria: Secondary | ICD-10-CM

## 2023-01-20 DIAGNOSIS — N200 Calculus of kidney: Secondary | ICD-10-CM

## 2023-01-20 DIAGNOSIS — Z87442 Personal history of urinary calculi: Secondary | ICD-10-CM

## 2023-01-20 DIAGNOSIS — N390 Urinary tract infection, site not specified: Secondary | ICD-10-CM | POA: Diagnosis not present

## 2023-01-20 LAB — URINALYSIS, COMPLETE
Bilirubin, UA: NEGATIVE
Glucose, UA: NEGATIVE
Ketones, UA: NEGATIVE
Nitrite, UA: NEGATIVE
Specific Gravity, UA: 1.025 (ref 1.005–1.030)
Urobilinogen, Ur: 0.2 mg/dL (ref 0.2–1.0)
pH, UA: 5.5 (ref 5.0–7.5)

## 2023-01-20 LAB — MICROSCOPIC EXAMINATION
Epithelial Cells (non renal): >10 /hpf — AB (ref 0–10)
WBC, UA: >30 /hpf — AB (ref 0–5)

## 2023-01-20 MED ORDER — NITROFURANTOIN MONOHYD MACRO 100 MG PO CAPS: 100.0000 mg | ORAL_CAPSULE | Freq: Two times a day (BID) | ORAL | 0 refills | Status: DC

## 2023-01-24 LAB — CULTURE, URINE COMPREHENSIVE

## 2023-02-15 ENCOUNTER — Telehealth: Payer: Self-pay | Admitting: Urology

## 2023-02-15 NOTE — Telephone Encounter (Signed)
I sent a MyChart message to Kara Garza and it has not been read.  Would you call her and see if she if she is having dysuria and/or malodorous urine?  If she is, we should get a KUB to look for stones.

## 2023-02-15 NOTE — Telephone Encounter (Signed)
LMTRC

## 2023-02-16 ENCOUNTER — Telehealth: Payer: Self-pay | Admitting: *Deleted

## 2023-02-16 NOTE — Telephone Encounter (Signed)
LMOM for patient to return call.

## 2023-02-16 NOTE — Telephone Encounter (Signed)
Left message on voicemail for patient to return call regarding referral for Lung Screening program from Dr. Juluis Pitch.

## 2023-02-20 NOTE — Telephone Encounter (Signed)
Spoke to patient and she states she not having any urinary symptoms at this time.

## 2023-03-27 ENCOUNTER — Encounter: Payer: Self-pay | Admitting: Urology

## 2023-03-27 ENCOUNTER — Telehealth: Payer: Self-pay | Admitting: *Deleted

## 2023-03-27 ENCOUNTER — Other Ambulatory Visit
Admission: RE | Admit: 2023-03-27 | Discharge: 2023-03-27 | Disposition: A | Discharge status: Home / Self Care | Payer: Medicare Other | Attending: Urology | Admitting: Urology

## 2023-03-27 ENCOUNTER — Ambulatory Visit (INDEPENDENT_AMBULATORY_CARE_PROVIDER_SITE_OTHER): Payer: Medicare Other | Admitting: Urology

## 2023-03-27 VITALS — BP 159/73 | HR 93 | Ht 65.0 in | Wt 155.0 lb

## 2023-03-27 DIAGNOSIS — R300 Dysuria: Secondary | ICD-10-CM

## 2023-03-27 DIAGNOSIS — N390 Urinary tract infection, site not specified: Secondary | ICD-10-CM | POA: Diagnosis not present

## 2023-03-27 LAB — URINALYSIS, COMPLETE (UACMP) WITH MICROSCOPIC
Bilirubin Urine: NEGATIVE
Glucose, UA: NEGATIVE mg/dL
Ketones, ur: NEGATIVE mg/dL
Nitrite: NEGATIVE
Protein, ur: 30 mg/dL — AB
Specific Gravity, Urine: 1.025 (ref 1.005–1.030)
WBC, UA: >50 WBC/hpf (ref 0–5)
pH: 6 (ref 5.0–8.0)

## 2023-03-27 MED ORDER — SULFAMETHOXAZOLE-TRIMETHOPRIM 800-160 MG PO TABS
1.0000 | ORAL_TABLET | Freq: Two times a day (BID) | ORAL | 0 refills | Status: DC
Start: 2023-03-27 — End: 2023-03-30

## 2023-03-27 NOTE — Progress Notes (Signed)
03/27/2023 3:51 PM   Kara Garza [birth year] [id number]  Referring provider: Dorothey Baseman, MD [phone] [name],  [place] [address]  Urological history: 1. Mixed incontinence -contributing factors of age, vaginal atrophy, anxiety, smoker, hyperglycemia, COPD and depression -Macroplastique 2017  2. rUTI's -contributing factors of age and vaginal atrophy -cysto 2021 NED -cysto 2023 NED  -Documented urine cultures over the last year  July 26, 2022 E. Coli  July 20th 2023 Klebsiella pneumoniae  3. Bilateral nephrolithiasis - Stone composition of calcium oxalate monohydrate 80% calcium oxalate dihydrate 20% - Right URS (07/2022)    Chief Complaint  Patient presents with   Dysuria    HPI: Kara Garza is a 77 y.o. female who presents today for burning, urgency and cloudy urine.  At her visit on 01/20/2023, she started to experience malodorous urine.  Two days later, she started having dysuria.  Patient denies any modifying or aggravating factors.  Patient denies any gross hematuria, dysuria or suprapubic/flank pain.  Patient denies any fevers, chills, nausea or vomiting.  She does not feel this is due to a stone.  She had some Macrobid on hand and she did start taking those on Tuesday.  UA yellow slightly cloudy, specific gravity 1.025, 1+ blood, pH 5.5, protein 2+, leukocyte trace, greater than 30 WBCs, 3-10 RBCs, greater than 10 epithelial cells and moderate bacteria.  She was started on Macrobid empirically.  Urine culture grew out mixed urogenital flora.  She was contacted and stated that her symptoms had abated.    UA yellow hazy, specific gravity 1.025, pH 6.0, hemoglobin small, 30 protein, trace leukocyte, 0-5 squamous epithelial cells, greater than 50 WBCs, 6-10 RBCs, many bacteria and WBC clumps present.  She states that late Friday evening, she had the sudden onset of urge incontinence, urgency, dysuria and frequency.  Patient denies any modifying or aggravating  factors.  Patient denies any gross hematuria or suprapubic/flank pain.  Patient denies any fevers, chills, nausea or vomiting.    PMH: Past Medical History:  Diagnosis Date   Anemia    H/O   Anxiety    Asthma    PT DENIES BUT PCP HAS LISTED ON H&P   Chronic kidney disease    H/O KIDNEY STONES   Complication of anesthesia    COPD (chronic obstructive pulmonary disease)    PT DENIES BUT PCP HAS LISTED ON H&P   Depression    Hypertension    PONV (postoperative nausea and vomiting)    NAUSEATED    Surgical History: Past Surgical History:  Procedure Laterality Date   BREAST BIOPSY Right yrs ago   benign   CYSTOSCOPY MACROPLASTIQUE IMPLANT N/A 04/12/2016   Procedure: CYSTOSCOPY MACROPLASTIQUE IMPLANT;  Surgeon: Orson Ape, MD;  Location: [facility];  Service: Urology;  Laterality: N/A;   CYSTOSCOPY/URETEROSCOPY/HOLMIUM LASER/STENT PLACEMENT Right 08/02/2022   Procedure: CYSTOSCOPY/URETEROSCOPY/HOLMIUM LASER/STENT PLACEMENT;  Surgeon: Riki Altes, MD;  Location: [facility];  Service: Urology;  Laterality: Right;   HAMMER TOE SURGERY     HERNIA REPAIR     TONSILLECTOMY     TUBAL LIGATION      Home Medications:  Allergies as of 03/27/2023       Reactions   Amoxicillin Diarrhea   Amoxicillin-pot Clavulanate Other (See Comments)   Causes GI issues   Ivp Dye [iodinated Contrast Media] Rash        Medication List        Accurate as of March 27, 2023  3:51 PM. If you have any questions, ask your nurse or doctor.          Cholecalciferol 25 MCG (1000 UT) tablet Take by mouth.   COSENTYX [place] Inject into the skin.   lisinopril-hydrochlorothiazide 20-12.5 MG tablet Commonly known as: ZESTORETIC Take 1 tablet by mouth daily.   nitrofurantoin (macrocrystal-monohydrate) 100 MG capsule Commonly known as: MACROBID Take 1 capsule (100 mg total) by mouth every 12 (twelve) hours.   rOPINIRole 0.25 MG tablet Commonly known as: REQUIP Take by mouth.    sulfamethoxazole-trimethoprim 800-160 MG tablet Commonly known as: BACTRIM DS Take 1 tablet by mouth every 12 (twelve) hours. Started by: Michiel Cowboy, PA-C   venlafaxine XR 37.5 MG 24 hr capsule Commonly known as: EFFEXOR-XR Take 37.5 mg by mouth daily.        Allergies:  Allergies  Allergen Reactions   Amoxicillin Diarrhea   Amoxicillin-Pot Clavulanate Other (See Comments)    Causes GI issues   Ivp Dye [Iodinated Contrast Media] Rash    Family History: Family History  Problem Relation Age of Onset   Breast cancer Mother 51    Social History:  reports that she has been smoking cigarettes. She has a 25.00 pack-year smoking history. She has never used smokeless tobacco. She reports current alcohol use. She reports that she does not use drugs.  ROS: Pertinent ROS in HPI  Physical Exam: BP (!) 159/73 (BP Location: Left Arm, Patient Position: Sitting, Cuff Size: Normal)   Pulse 93   Ht 5\' 5"  (1.651 m)   Wt 155 lb (70.3 kg)   BMI 25.79 kg/m   Constitutional:  Well nourished. Alert and oriented, No acute distress. HEENT: [place] AT, moist mucus membranes.  Trachea midline Cardiovascular: No clubbing, cyanosis, or edema. Respiratory: Normal respiratory effort, no increased work of breathing. Neurologic: Grossly intact, no focal deficits, moving all 4 extremities. Psychiatric: Normal mood and affect.    Laboratory Data: Urinalysis Component     Latest Ref Rng 03/27/2023  Color, Urine     YELLOW  YELLOW   Appearance     CLEAR  HAZY !   Specific Gravity, Urine     1.005 - 1.030  1.025   pH     5.0 - 8.0  6.0   Glucose, UA     NEGATIVE mg/dL NEGATIVE   Hgb urine dipstick     NEGATIVE  SMALL !   Bilirubin Urine     NEGATIVE  NEGATIVE   Ketones, ur     NEGATIVE mg/dL NEGATIVE   Protein     NEGATIVE mg/dL 30 !   Nitrite     NEGATIVE  NEGATIVE   Leukocytes,Ua     NEGATIVE  TRACE !   RBC / HPF     0 - 5 RBC/hpf 6-10   WBC, UA     0 - 5 WBC/hpf >50    Bacteria, UA     NONE SEEN  MANY !   Squamous Epithelial / HPF     0 - 5 /HPF 0-5   WBC Clumps PRESENT     Legend: ! Abnormal I have reviewed the labs.   Pertinent Imaging: N/A  Assessment & Plan:    1. Dysuria -UA grossly infected -Urine culture pending -Started Septra DS twice daily for 7 days, will adjust if necessary once urine culture and sensitivities are available  2. rUTI's -continue vaginal estrogen cream 3 nights weekly  -Once this current urinary tract infection has been addressed, we  will start a prophylactic antibiotic for 3 months -She will advise us of any breakthrough infections when she is on the prophylactic antibiotic  Return in about 3 months (around 06/26/2023) for symptom recheck.  These notes generated with voice recognition software. I apologize for typographical errors.  Cloretta NedSHANNON Forrest Demuro, PA-C  [facility] [address]  [address] [place], [place] [id number] [phone]

## 2023-03-27 NOTE — Telephone Encounter (Signed)
Pt calling stating she think she is having another UTI. Last urine culture 01/2023 did not grow anything. Pt states she is having burning, urgency and cloudy urine, please advise.

## 2023-03-27 NOTE — Telephone Encounter (Signed)
Spoke with patient and scheduled appt in [place], pt will go to the lab for UA and then to Urology for appt with shannon.  Orders in

## 2023-03-29 LAB — URINE CULTURE: Culture: >=100000 — AB

## 2023-03-30 ENCOUNTER — Telehealth: Payer: Self-pay | Admitting: Family Medicine

## 2023-03-30 LAB — URINE CULTURE

## 2023-03-30 MED ORDER — TRIMETHOPRIM 100 MG PO TABS: 100.0000 mg | ORAL_TABLET | Freq: Every day | ORAL | 0 refills | Status: DC

## 2023-03-30 NOTE — Telephone Encounter (Signed)
-----   Message from Harle Battiest, PA-C sent at 03/30/2023 11:03 AM EDT ----- Please let Kara Garza know that we have chosen correctly with the Septra DS twice daily for 7 days for her culture results.  When she finishes that, I want her to start trimethoprim 100 mg once daily.  We will then see her back in July.

## 2023-03-30 NOTE — Telephone Encounter (Signed)
Patient notified and voiced understanding. ABX was sent to pharmacy. °

## 2023-04-07 ENCOUNTER — Other Ambulatory Visit: Payer: Self-pay | Admitting: Urology

## 2023-04-07 ENCOUNTER — Telehealth: Payer: Self-pay

## 2023-04-07 ENCOUNTER — Encounter: Payer: Self-pay | Admitting: Urology

## 2023-04-07 MED ORDER — CEPHALEXIN 250 MG PO CAPS: 250.0000 mg | ORAL_CAPSULE | Freq: Every day | ORAL | 0 refills | Status: DC

## 2023-04-07 NOTE — Telephone Encounter (Signed)
Patient states she thinks she tolerated Keflex ok and is willing to try it. [employer] adviser

## 2023-04-07 NOTE — Telephone Encounter (Signed)
Patient called stating that she has been very sick since taking Septra and now Trimethoprim for her UTI. Patient states 4 days in of taking Septra she developed severe nausea, terrible indigestion/heartburn, diarrhea, vomited a few times, sick to her stomach, breaking out in sweats and weakness/fatigue. She finished Septra Sunday 04/02/23 and started Trimethoprim on Monday 04/03/23. Every day this week she thought she would get better but has not, maybe some worse. She goggled and saw that Trimethoprim is a combo of like septra and another medication. She then thought back and realized she has had this reaction to Septra like medications to the point she had to go to ER for fluids.  The way these medications make her feel she would rather have UTI then deal with this. Her UA symptoms have all resolved. Please advise

## 2023-07-02 NOTE — Progress Notes (Signed)
07/03/2023 2:54 PM   Kara Garza Aug 03, 1946 [id number]  Referring provider: Dorothey Baseman, MD [phone] Kara Garza,  [place] [address]  Urological history: 1. Mixed incontinence -contributing factors of age, vaginal atrophy, anxiety, smoker, hyperglycemia, COPD and depression -Macroplastique 2017  2. rUTI's -contributing factors of age and vaginal atrophy -cysto 2021 NED -cysto 2023 NED  -Documented urine cultures over the last year  March 27, 2023 E.Coli   January 20, 2023 MUF  July 26, 2022 E. Coli  July 20th 2023 Klebsiella pneumoniae  3. Bilateral nephrolithiasis - Stone composition of calcium oxalate monohydrate 80% calcium oxalate dihydrate 20% - Right URS (07/2022)    Chief Complaint  Patient presents with   Recurrent UTI    HPI: Kara Garza is a 77 y.o. female who presents today for three month follow up after a course of prophylaxis Keflex.     At her visit on 01/20/2023, she started to experience malodorous urine.  Two days later, she started having dysuria.  Patient denies any modifying or aggravating factors.  Patient denies any gross hematuria, dysuria or suprapubic/flank pain.  Patient denies any fevers, chills, nausea or vomiting.  She does not feel this is due to a stone.  She had some Macrobid on hand and she did start taking those on Tuesday.  UA yellow slightly cloudy, specific gravity 1.025, 1+ blood, pH 5.5, protein 2+, leukocyte trace, greater than 30 WBCs, 3-10 RBCs, greater than 10 epithelial cells and moderate bacteria.  She was started on Macrobid empirically.  Urine culture grew out mixed urogenital flora.  She was contacted and stated that her symptoms had abated.    At her visit on 03/27/2023, UA yellow hazy, specific gravity 1.025, pH 6.0, hemoglobin small, 30 protein, trace leukocyte, 0-5 squamous epithelial cells, greater than 50 WBCs, 6-10 RBCs, many bacteria and WBC clumps present.  She states that late Friday evening, she had the sudden  onset of urge incontinence, urgency, dysuria and frequency.  Patient denies any modifying or aggravating factors.  Patient denies any gross hematuria or suprapubic/flank pain.  Patient denies any fevers, chills, nausea or vomiting.   Her urine culture was positive for E. Coli.  She was treated with  culture appropriate antibiotics and started on Keflex 250 mg daily.    She has since stopped her Cosentyx and has been taking the Keflex to 50 mg daily and has not had any further UTI symptoms.   Patient denies any modifying or aggravating factors.  Patient denies any recent UTI's, gross hematuria, dysuria or suprapubic/flank pain.  Patient denies any fevers, chills, nausea or vomiting.   UA yellow clear, specific gravity 1.025, pH 5.5, trace leuks, 11-20 squames, non-squamous epithelial cells are present, 6-10 WBCs and a few bacteria.  PMH: Past Medical History:  Diagnosis Date   Anemia    H/O   Anxiety    Asthma    PT DENIES BUT PCP HAS LISTED ON H&P   Chronic kidney disease    H/O KIDNEY STONES   Complication of anesthesia    COPD (chronic obstructive pulmonary disease) ([facility])    PT DENIES BUT PCP HAS LISTED ON H&P   Depression    Hypertension    PONV (postoperative nausea and vomiting)    NAUSEATED    Surgical History: Past Surgical History:  Procedure Laterality Date   BREAST BIOPSY Right yrs ago   benign   CYSTOSCOPY MACROPLASTIQUE IMPLANT N/A 04/12/2016   Procedure: CYSTOSCOPY MACROPLASTIQUE IMPLANT;  Surgeon: Orson Ape, MD;  Location: [facility];  Service: Urology;  Laterality: N/A;   CYSTOSCOPY/URETEROSCOPY/HOLMIUM LASER/STENT PLACEMENT Right 08/02/2022   Procedure: CYSTOSCOPY/URETEROSCOPY/HOLMIUM LASER/STENT PLACEMENT;  Surgeon: Riki Altes, MD;  Location: [facility];  Service: Urology;  Laterality: Right;   HAMMER TOE SURGERY     HERNIA REPAIR     TONSILLECTOMY     TUBAL LIGATION      Home Medications:  Allergies as of 07/03/2023       Reactions   Septra  [sulfamethoxazole-trimethoprim] Other (See Comments)   severe nausea, terrible indigestion/heartburn, diarrhea, vomited a few times, sick to her stomach, breaking out in sweats and weakness/fatigue.   Amoxicillin Diarrhea   Amoxicillin-pot Clavulanate Other (See Comments)   Causes GI issues   Ivp Dye [iodinated Contrast Media] Rash        Medication List        Accurate as of July 03, 2023  2:54 PM. If you have any questions, ask your nurse or doctor.          STOP taking these medications    Cholecalciferol 25 MCG (1000 UT) tablet Stopped by: Yassir Enis   COSENTYX [place] Stopped by: Marcell Chavarin   trimethoprim 100 MG tablet Commonly known as: TRIMPEX Stopped by: Mariapaula Krist       TAKE these medications    cephALEXin 250 MG capsule Commonly known as: Keflex Take 1 capsule (250 mg total) by mouth daily.   lisinopril-hydrochlorothiazide 20-12.5 MG tablet Commonly known as: ZESTORETIC Take 1 tablet by mouth daily.   rOPINIRole 0.25 MG tablet Commonly known as: REQUIP Take by mouth. Take 1 tablet (0.25 mg total) by mouth at bedtime Start 1 tab nightly. Can increase by 1 tab every 5-7 nights if needed   venlafaxine XR 37.5 MG 24 hr capsule Commonly known as: EFFEXOR-XR Take 37.5 mg by mouth daily.        Allergies:  Allergies  Allergen Reactions   Septra [Sulfamethoxazole-Trimethoprim] Other (See Comments)    severe nausea, terrible indigestion/heartburn, diarrhea, vomited a few times, sick to her stomach, breaking out in sweats and weakness/fatigue.   Amoxicillin Diarrhea   Amoxicillin-Pot Clavulanate Other (See Comments)    Causes GI issues   Ivp Dye [Iodinated Contrast Media] Rash    Family History: Family History  Problem Relation Age of Onset   Breast cancer Mother 72    Social History:  reports that she has been smoking cigarettes. She has a 25 pack-year smoking history. She has never used smokeless tobacco. She reports current  alcohol use. She reports that she does not use drugs.  ROS: Pertinent ROS in HPI  Physical Exam: BP 120/65 (BP Location: Left Arm, Patient Position: Sitting, Cuff Size: Normal)   Pulse 87   Ht 5\' 5"  (1.651 m)   Wt 155 lb (70.3 kg)   BMI 25.79 kg/m   Constitutional:  Well nourished. Alert and oriented, No acute distress. HEENT: [place] AT, moist mucus membranes.  Trachea midline Cardiovascular: No clubbing, cyanosis, or edema. Respiratory: Normal respiratory effort, no increased work of breathing. Neurologic: Grossly intact, no focal deficits, moving all 4 extremities. Psychiatric: Normal mood and affect.    Laboratory Data: Component     Latest Ref Rng 07/03/2023  Color, Urine     YELLOW  YELLOW   Appearance     CLEAR  CLEAR   Specific Gravity, Urine     1.005 - 1.030  1.025   pH     5.0 -  8.0  5.5   Glucose, UA     NEGATIVE mg/dL NEGATIVE   Hgb urine dipstick     NEGATIVE  NEGATIVE   Bilirubin Urine     NEGATIVE  NEGATIVE   Ketones, ur     NEGATIVE mg/dL NEGATIVE   Protein     NEGATIVE mg/dL NEGATIVE   Nitrite     NEGATIVE  NEGATIVE   Leukocytes,Ua     NEGATIVE  TRACE !   RBC / HPF     0 - 5 RBC/hpf NONE SEEN   WBC, UA     0 - 5 WBC/hpf 6-10   Bacteria, UA     NONE SEEN  FEW !   Squamous Epithelial / HPF     0 - 5 /HPF 11-20   Non Squamous Epithelial     NONE SEEN  PRESENT !     Legend: ! Abnormal I have reviewed the labs.   Pertinent Imaging: N/A  Assessment & Plan:    1. Dysuria -abated  2. rUTI's -continue vaginal estrogen cream 3 nights weekly  -she has discontinued her Cosentyx and she continues her Keflex 250 mg daily -she has been off the Cosentyx for three months, but she would like another three months of Keflex every other day as a precautionary as she is fearful she may experience an UTI and she states her PCP told her people can die of an UTI  Return in about 3 months (around 10/03/2023) for recheck .  These notes generated with voice  recognition software. I apologize for typographical errors.  Michiel Cowboy, PA-C  [facility] [place]  [address]  [address] [place], [place] [address] [phone]

## 2023-07-03 ENCOUNTER — Other Ambulatory Visit
Admission: RE | Admit: 2023-07-03 | Discharge: 2023-07-03 | Disposition: A | Discharge status: Home / Self Care | Payer: Medicare Other | Source: Ambulatory Visit | Attending: Urology | Admitting: Urology

## 2023-07-03 ENCOUNTER — Ambulatory Visit (INDEPENDENT_AMBULATORY_CARE_PROVIDER_SITE_OTHER): Payer: Medicare Other | Admitting: Urology

## 2023-07-03 ENCOUNTER — Encounter: Payer: Self-pay | Admitting: Urology

## 2023-07-03 VITALS — BP 120/65 | HR 87 | Ht 65.0 in | Wt 155.0 lb

## 2023-07-03 DIAGNOSIS — Z87440 Personal history of urinary (tract) infections: Secondary | ICD-10-CM

## 2023-07-03 DIAGNOSIS — N390 Urinary tract infection, site not specified: Secondary | ICD-10-CM

## 2023-07-03 DIAGNOSIS — Z09 Encounter for follow-up examination after completed treatment for conditions other than malignant neoplasm: Secondary | ICD-10-CM

## 2023-07-03 LAB — URINALYSIS, COMPLETE (UACMP) WITH MICROSCOPIC
Bilirubin Urine: NEGATIVE
Glucose, UA: NEGATIVE mg/dL
Hgb urine dipstick: NEGATIVE
Ketones, ur: NEGATIVE mg/dL
Nitrite: NEGATIVE
Protein, ur: NEGATIVE mg/dL
RBC / HPF: NONE SEEN RBC/hpf (ref 0–5)
Specific Gravity, Urine: 1.025 (ref 1.005–1.030)
pH: 5.5 (ref 5.0–8.0)

## 2023-07-03 MED ORDER — CEPHALEXIN 250 MG PO CAPS: 250.0000 mg | ORAL_CAPSULE | Freq: Every day | ORAL | 0 refills | Status: DC

## 2023-07-04 LAB — URINE CULTURE: Culture: <10000 — AB

## 2023-08-03 ENCOUNTER — Other Ambulatory Visit: Payer: Self-pay | Admitting: *Deleted

## 2023-08-03 DIAGNOSIS — N200 Calculus of kidney: Secondary | ICD-10-CM

## 2023-09-01 ENCOUNTER — Ambulatory Visit (INDEPENDENT_AMBULATORY_CARE_PROVIDER_SITE_OTHER): Payer: Medicare Other | Admitting: Urology

## 2023-09-01 ENCOUNTER — Ambulatory Visit
Admission: RE | Admit: 2023-09-01 | Discharge: 2023-09-01 | Disposition: A | Discharge status: Home / Self Care | Payer: Medicare Other | Attending: Urology | Admitting: Urology

## 2023-09-01 ENCOUNTER — Ambulatory Visit
Admission: RE | Admit: 2023-09-01 | Discharge: 2023-09-01 | Disposition: A | Discharge status: Home / Self Care | Payer: Medicare Other | Source: Ambulatory Visit | Attending: Urology | Admitting: Urology

## 2023-09-01 ENCOUNTER — Encounter: Payer: Self-pay | Admitting: Urology

## 2023-09-01 VITALS — BP 145/72 | HR 87 | Ht 65.0 in | Wt 146.0 lb

## 2023-09-01 DIAGNOSIS — Z87440 Personal history of urinary (tract) infections: Secondary | ICD-10-CM

## 2023-09-01 DIAGNOSIS — N390 Urinary tract infection, site not specified: Secondary | ICD-10-CM

## 2023-09-01 DIAGNOSIS — R109 Unspecified abdominal pain: Secondary | ICD-10-CM | POA: Diagnosis not present

## 2023-09-01 DIAGNOSIS — N200 Calculus of kidney: Secondary | ICD-10-CM

## 2023-09-01 DIAGNOSIS — Z87442 Personal history of urinary calculi: Secondary | ICD-10-CM | POA: Diagnosis not present

## 2023-09-01 MED ORDER — TAMSULOSIN HCL 0.4 MG PO CAPS: 0.4000 mg | ORAL_CAPSULE | Freq: Every day | ORAL | 0 refills | Status: DC

## 2023-09-01 NOTE — Progress Notes (Signed)
I, Maysun Anabel Bene, acting as a scribe for Riki Altes, MD., have documented all relevant documentation on the behalf of Riki Altes, MD, as directed by Riki Altes, MD while in the presence of Riki Altes, MD.  09/01/2023 3:17 PM   Narda Rutherford Feb 22, 1946 [id number]  Referring provider: Dorothey Baseman, MD [phone] [place],  [place] [address]  Chief Complaint  Patient presents with   Nephrolithiasis   Urologic History: 1. Personal history of urinary calculi Ureteroscopic removal of a right distal ureteral calculus 08/02/22.  Stone analysis CaOx mono/CaOx di 80/20.   2. Recurrent UTI   HPI: Kara Garza is a 77 y.o. female presents for annual follow-up.   Since last year's visit, she has had several PA visits for recurrent UTIs and is presently on low-dose antibiotic prophylaxis with cefalexin and states she has remained infection-free since February 2024.  She is scheduled for a follow-up urinalysis with Michiel Cowboy next month.  c/o of a 2 week history of intermittent right flank pain, which feels like previous stone symptoms, though not as severe.  No fever, chills, nausea, or vomiting.   PMH: Past Medical History:  Diagnosis Date   Anemia    H/O   Anxiety    Asthma    PT DENIES BUT PCP HAS LISTED ON H&P   Chronic kidney disease    H/O KIDNEY STONES   Complication of anesthesia    COPD (chronic obstructive pulmonary disease) (HCC)    PT DENIES BUT PCP HAS LISTED ON H&P   Depression    Hypertension    PONV (postoperative nausea and vomiting)    NAUSEATED    Surgical History: Past Surgical History:  Procedure Laterality Date   BREAST BIOPSY Right yrs ago   benign   CYSTOSCOPY MACROPLASTIQUE IMPLANT N/A 04/12/2016   Procedure: CYSTOSCOPY MACROPLASTIQUE IMPLANT;  Surgeon: Orson Ape, MD;  Location: [facility];  Service: Urology;  Laterality: N/A;   CYSTOSCOPY/URETEROSCOPY/HOLMIUM LASER/STENT PLACEMENT Right 08/02/2022   Procedure:  CYSTOSCOPY/URETEROSCOPY/HOLMIUM LASER/STENT PLACEMENT;  Surgeon: Riki Altes, MD;  Location: [facility];  Service: Urology;  Laterality: Right;   HAMMER TOE SURGERY     HERNIA REPAIR     TONSILLECTOMY     TUBAL LIGATION      Home Medications:  Allergies as of 09/01/2023       Reactions   Septra [sulfamethoxazole-trimethoprim] Other (See Comments)   severe nausea, terrible indigestion/heartburn, diarrhea, vomited a few times, sick to her stomach, breaking out in sweats and weakness/fatigue.   Amoxicillin Diarrhea   Amoxicillin-pot Clavulanate Other (See Comments)   Causes GI issues   Ivp Dye [iodinated Contrast Media] Rash        Medication List        Accurate as of September 01, 2023  3:17 PM. If you have any questions, ask your nurse or doctor.          cephALEXin 250 MG capsule Commonly known as: Keflex Take 1 capsule (250 mg total) by mouth daily.   lisinopril-hydrochlorothiazide 20-12.5 MG tablet Commonly known as: ZESTORETIC Take 1 tablet by mouth daily.   rOPINIRole 0.25 MG tablet Commonly known as: REQUIP Take by mouth. Take 1 tablet (0.25 mg total) by mouth at bedtime Start 1 tab nightly. Can increase by 1 tab every 5-7 nights if needed   tamsulosin 0.4 MG Caps capsule Commonly known as: FLOMAX Take 1 capsule (0.4 mg total) by mouth daily. Started by: Lorin Picket  C Hae Ahlers   venlafaxine XR 37.5 MG 24 hr capsule Commonly known as: EFFEXOR-XR Take 37.5 mg by mouth daily.        Allergies:  Allergies  Allergen Reactions   Septra [Sulfamethoxazole-Trimethoprim] Other (See Comments)    severe nausea, terrible indigestion/heartburn, diarrhea, vomited a few times, sick to her stomach, breaking out in sweats and weakness/fatigue.   Amoxicillin Diarrhea   Amoxicillin-Pot Clavulanate Other (See Comments)    Causes GI issues   Ivp Dye [Iodinated Contrast Media] Rash    Family History: Family History  Problem Relation Age of Onset   Breast cancer Mother  10    Social History:  reports that she has been smoking cigarettes. She has a 25 pack-year smoking history. She has never used smokeless tobacco. She reports current alcohol use. She reports that she does not use drugs.   Physical Exam: BP (!) 145/72   Pulse 87   Ht 5\' 5"  (1.651 m)   Wt 146 lb (66.2 kg)   BMI 24.30 kg/m   Constitutional:  Alert and oriented, No acute distress. HEENT: [place] AT Respiratory: Normal respiratory effort, no increased work of breathing. Psychiatric: Normal mood and affect.   Pertinent Imaging: KUB performed today was personally reviewed and interpreted. There are no calcifications identified suspicious for recurrent urinary tract stones. The KUB has not been interpreted by radiology.   Assessment & Plan:    1. Right flank pain Symptoms similar to previous stone episode.  Schedule renal stone CT and we'll call with results.  Rx tamsulosin 0.4 mg sent to pharmacy.  2. Recurrent UTI Cont low dose prophylaxis  I have reviewed the above documentation for accuracy and completeness, and I agree with the above.   Riki Altes, MD  [facility] [address], [address] [place], [place] [address] [phone]

## 2023-09-02 ENCOUNTER — Encounter: Payer: Self-pay | Admitting: Urology

## 2023-09-06 ENCOUNTER — Ambulatory Visit
Admission: RE | Admit: 2023-09-06 | Discharge: 2023-09-06 | Disposition: A | Discharge status: Home / Self Care | Payer: Medicare Other | Source: Ambulatory Visit | Attending: Urology | Admitting: Urology

## 2023-09-06 DIAGNOSIS — R109 Unspecified abdominal pain: Secondary | ICD-10-CM | POA: Diagnosis present

## 2023-09-06 DIAGNOSIS — Z87441 Personal history of nephrotic syndrome: Secondary | ICD-10-CM | POA: Insufficient documentation

## 2023-09-06 DIAGNOSIS — N200 Calculus of kidney: Secondary | ICD-10-CM | POA: Diagnosis not present

## 2023-09-06 DIAGNOSIS — Z87442 Personal history of urinary calculi: Secondary | ICD-10-CM | POA: Insufficient documentation

## 2023-10-23 ENCOUNTER — Encounter: Payer: Self-pay | Admitting: Ophthalmology

## 2023-10-24 NOTE — Anesthesia Preprocedure Evaluation (Addendum)
Anesthesia Evaluation  Patient identified by MRN, date of birth, ID band Patient awake    Reviewed: Allergy & Precautions, H&P , NPO status , Patient's Chart, lab work & pertinent test results  Airway Mallampati: IV  TM Distance: >3 FB Neck ROM: Full    Dental no notable dental hx. (+) Missing   Pulmonary neg pulmonary ROS, Current Smoker   Pulmonary exam normal breath sounds clear to auscultation       Cardiovascular hypertension, negative cardio ROS Normal cardiovascular exam Rhythm:Regular Rate:Normal  ECG 07/29/22:  Sinus tachycardia Right atrial enlargement Borderline ECG When compared with ECG of 06-Feb-2020 15:12, No significant change was found      Neuro/Psych negative neurological ROS  negative psych ROS   GI/Hepatic negative GI ROS, Neg liver ROS,,,  Endo/Other  negative endocrine ROS    Renal/GU negative Renal ROS  negative genitourinary   Musculoskeletal negative musculoskeletal ROS (+)    Abdominal   Peds negative pediatric ROS (+)  Hematology negative hematology ROS (+)   Anesthesia Other Findings Hypertension  Anxiety Depression  Chronic kidney disease Anemia  PONV (postoperative nausea and vomiting) COPD (chronic obstructive pulmonary disease)  smoker Asthma  Wears dentures Wears contact lenses     Reproductive/Obstetrics negative OB ROS                              Anesthesia Physical Anesthesia Plan  ASA: 3  Anesthesia Plan: MAC   Post-op Pain Management:    Induction: Intravenous  PONV Risk Score and Plan:   Airway Management Planned: Natural Airway and Nasal Cannula  Additional Equipment:   Intra-op Plan:   Post-operative Plan:   Informed Consent: I have reviewed the patients History and Physical, chart, labs and discussed the procedure including the risks, benefits and alternatives for the proposed anesthesia with the patient or  authorized representative who has indicated his/her understanding and acceptance.     Dental Advisory Given  Plan Discussed with: Anesthesiologist, CRNA and Surgeon  Anesthesia Plan Comments: (Patient consented for risks of anesthesia including but not limited to:  - adverse reactions to medications - damage to eyes, teeth, lips or other oral mucosa - nerve damage due to positioning  - sore throat or hoarseness - Damage to heart, brain, nerves, lungs, other parts of body or loss of life  Patient voiced understanding and assent.)         Anesthesia Quick Evaluation

## 2023-10-26 NOTE — Discharge Instructions (Signed)

## 2023-10-30 ENCOUNTER — Encounter: Payer: Self-pay | Admitting: Ophthalmology

## 2023-10-30 ENCOUNTER — Encounter
Admission: RE | Disposition: A | Discharge status: Home / Self Care | Payer: Self-pay | Source: Home / Self Care | Attending: Ophthalmology

## 2023-10-30 ENCOUNTER — Ambulatory Visit: Payer: Medicare Other | Admitting: Anesthesiology

## 2023-10-30 ENCOUNTER — Other Ambulatory Visit: Payer: Self-pay

## 2023-10-30 ENCOUNTER — Ambulatory Visit
Admission: RE | Admit: 2023-10-30 | Discharge: 2023-10-30 | Disposition: A | Discharge status: Home / Self Care | Payer: Medicare Other | Attending: Ophthalmology | Admitting: Ophthalmology

## 2023-10-30 DIAGNOSIS — H2512 Age-related nuclear cataract, left eye: Secondary | ICD-10-CM | POA: Diagnosis present

## 2023-10-30 DIAGNOSIS — Z5986 Financial insecurity: Secondary | ICD-10-CM | POA: Diagnosis not present

## 2023-10-30 DIAGNOSIS — F419 Anxiety disorder, unspecified: Secondary | ICD-10-CM | POA: Diagnosis not present

## 2023-10-30 DIAGNOSIS — N189 Chronic kidney disease, unspecified: Secondary | ICD-10-CM | POA: Diagnosis not present

## 2023-10-30 DIAGNOSIS — F17210 Nicotine dependence, cigarettes, uncomplicated: Secondary | ICD-10-CM | POA: Diagnosis not present

## 2023-10-30 DIAGNOSIS — F32A Depression, unspecified: Secondary | ICD-10-CM | POA: Insufficient documentation

## 2023-10-30 DIAGNOSIS — I129 Hypertensive chronic kidney disease with stage 1 through stage 4 chronic kidney disease, or unspecified chronic kidney disease: Secondary | ICD-10-CM | POA: Diagnosis not present

## 2023-10-30 DIAGNOSIS — J4489 Other specified chronic obstructive pulmonary disease: Secondary | ICD-10-CM | POA: Diagnosis not present

## 2023-10-30 HISTORY — DX: Presence of dental prosthetic device (complete) (partial): Z97.2

## 2023-10-30 HISTORY — DX: Presence of spectacles and contact lenses: Z97.3

## 2023-10-30 HISTORY — PX: CATARACT EXTRACTION W/PHACO: SHX586

## 2023-10-30 SURGERY — PHACOEMULSIFICATION, CATARACT, WITH IOL INSERTION
Anesthesia: Monitor Anesthesia Care | Site: Eye | Laterality: Left

## 2023-10-30 MED ORDER — FENTANYL CITRATE (PF) 100 MCG/2ML IJ SOLN
INTRAMUSCULAR | Status: AC
  Filled 2023-10-30: qty 2

## 2023-10-30 MED ORDER — MIDAZOLAM HCL 2 MG/2ML IJ SOLN
INTRAMUSCULAR | Status: AC
  Filled 2023-10-30: qty 2

## 2023-10-30 MED ORDER — ARMC OPHTHALMIC DILATING DROPS
OPHTHALMIC | Status: AC
  Filled 2023-10-30: qty 0.5

## 2023-10-30 MED ORDER — FENTANYL CITRATE (PF) 100 MCG/2ML IJ SOLN
INTRAMUSCULAR | Status: DC | PRN
  Administered 2023-10-30: 50 ug via INTRAVENOUS

## 2023-10-30 MED ORDER — ARMC OPHTHALMIC DILATING DROPS
1.0000 | OPHTHALMIC | Status: DC | PRN
  Administered 2023-10-30 (×2): 1 via OPHTHALMIC

## 2023-10-30 MED ORDER — TETRACAINE HCL 0.5 % OP SOLN
1.0000 [drp] | OPHTHALMIC | Status: DC | PRN
  Administered 2023-10-30 (×3): 1 [drp] via OPHTHALMIC

## 2023-10-30 MED ORDER — SIGHTPATH DOSE#1 NA HYALUR & NA CHOND-NA HYALUR IO KIT
PACK | INTRAOCULAR | Status: DC | PRN
  Administered 2023-10-30: 1 via OPHTHALMIC

## 2023-10-30 MED ORDER — LIDOCAINE HCL (PF) 2 % IJ SOLN
INTRAOCULAR | Status: DC | PRN
  Administered 2023-10-30: 1 mL via INTRAOCULAR

## 2023-10-30 MED ORDER — MIDAZOLAM HCL 2 MG/2ML IJ SOLN
INTRAMUSCULAR | Status: DC | PRN
  Administered 2023-10-30 (×2): 1 mg via INTRAVENOUS

## 2023-10-30 MED ORDER — SODIUM CHLORIDE 0.9% FLUSH
INTRAVENOUS | Status: DC | PRN
  Administered 2023-10-30 (×3): 10 mL via INTRAVENOUS

## 2023-10-30 MED ORDER — SIGHTPATH DOSE#1 BSS IO SOLN
INTRAOCULAR | Status: DC | PRN
  Administered 2023-10-30: 92 mL via OPHTHALMIC

## 2023-10-30 MED ORDER — ONDANSETRON HCL 4 MG/2ML IJ SOLN
INTRAMUSCULAR | Status: DC | PRN
  Administered 2023-10-30: 4 mg via INTRAVENOUS

## 2023-10-30 MED ORDER — SIGHTPATH DOSE#1 BSS IO SOLN
INTRAOCULAR | Status: DC | PRN
  Administered 2023-10-30: 15 mL via INTRAOCULAR

## 2023-10-30 MED ORDER — TETRACAINE HCL 0.5 % OP SOLN
OPHTHALMIC | Status: AC
  Filled 2023-10-30: qty 4

## 2023-10-30 MED ORDER — MOXIFLOXACIN HCL 0.5 % OP SOLN
OPHTHALMIC | Status: DC | PRN
  Administered 2023-10-30: .2 mL via OPHTHALMIC

## 2023-10-30 SURGICAL SUPPLY — 11 items
CATARACT SUITE SIGHTPATH (MISCELLANEOUS) ×1
DISSECTOR HYDRO NUCLEUS 50X22 (MISCELLANEOUS) ×1 IMPLANT
FEE CATARACT SUITE SIGHTPATH (MISCELLANEOUS) ×1 IMPLANT
GLOVE SURG GAMMEX PI TX LF 7.5 (GLOVE) ×1 IMPLANT
GLOVE SURG SYN 8.5 E (GLOVE) ×1
GLOVE SURG SYN 8.5 PF PI (GLOVE) ×1 IMPLANT
LENS IOL TECNIS EYHANCE 14.0 (Intraocular Lens) IMPLANT
NDL FILTER BLUNT 18X1 1/2 (NEEDLE) ×1 IMPLANT
NEEDLE FILTER BLUNT 18X1 1/2 (NEEDLE) ×1
SYR 3ML LL SCALE MARK (SYRINGE) ×1 IMPLANT
SYR 5ML LL (SYRINGE) ×1 IMPLANT

## 2023-10-30 NOTE — Anesthesia Postprocedure Evaluation (Signed)
Anesthesia Post Note  Patient: Kara Garza  Procedure(s) Performed: CATARACT EXTRACTION PHACO AND INTRAOCULAR LENS PLACEMENT (IOC) LEFT 7.25 00:43.0 (Left: Eye)  Patient location during evaluation: PACU Anesthesia Type: MAC Level of consciousness: awake and alert Pain management: pain level controlled Vital Signs Assessment: post-procedure vital signs reviewed and stable Respiratory status: spontaneous breathing, nonlabored ventilation, respiratory function stable and patient connected to nasal cannula oxygen Cardiovascular status: stable and blood pressure returned to baseline Postop Assessment: no apparent nausea or vomiting Anesthetic complications: no   No notable events documented.   Last Vitals:  Vitals:   10/30/23 1110 10/30/23 1112  BP: (!) 165/74 (!) 158/75  Pulse: 80 77  Resp: 10 13  Temp:  (!) 36.1 C  SpO2: 99% 97%    Last Pain:  Vitals:   10/30/23 1054  TempSrc:   PainSc: Asleep                 Tabias Swayze C Spence Soberano

## 2023-10-30 NOTE — Transfer of Care (Signed)
Immediate Anesthesia Transfer of Care Note  Patient: Kearston Kohn Harp  Procedure(s) Performed: CATARACT EXTRACTION PHACO AND INTRAOCULAR LENS PLACEMENT (IOC) LEFT 7.25 00:43.0 (Left: Eye)  Patient Location: PACU  Anesthesia Type:MAC  Level of Consciousness: awake, alert , and oriented  Airway & Oxygen Therapy: Patient connected to nasal cannula oxygen  Post-op Assessment: Report given to RN  Post vital signs: Reviewed  Last Vitals: BP as noted.   Dr. Juel Burrow in PACU assessing pt.   Pt aware of need to take HTN meds today.   Family pres\ent and informed.  Vitals Value Taken Time  BP    Temp    Pulse 87 10/30/23 1056  Resp 17 10/30/23 1056  SpO2 93 % 10/30/23 1056  Vitals shown include unfiled device data.  Last Pain:  Vitals:   10/30/23 1054  TempSrc:   PainSc: Asleep         Complications: No notable events documented.

## 2023-10-30 NOTE — H&P (Signed)
[facility]   Primary Care Physician:  Dorothey Baseman, MD Ophthalmologist: Dr. Willey Blade  Pre-Procedure History & Physical: HPI:  Kara Garza is a 77 y.o. female here for cataract surgery.   Past Medical History:  Diagnosis Date   Anemia    H/O   Anxiety    Asthma    PT DENIES BUT PCP HAS LISTED ON H&P   Chronic kidney disease    H/O KIDNEY STONES   Complication of anesthesia    COPD (chronic obstructive pulmonary disease) (HCC)    PT DENIES BUT PCP HAS LISTED ON H&P   Depression    Hypertension    PONV (postoperative nausea and vomiting)    NAUSEATED   Wears contact lenses    Wears dentures    partial upper, full lower    Past Surgical History:  Procedure Laterality Date   BREAST BIOPSY Right yrs ago   benign   CYSTOSCOPY MACROPLASTIQUE IMPLANT N/A 04/12/2016   Procedure: CYSTOSCOPY MACROPLASTIQUE IMPLANT;  Surgeon: Orson Ape, MD;  Location: [facility];  Service: Urology;  Laterality: N/A;   CYSTOSCOPY/URETEROSCOPY/HOLMIUM LASER/STENT PLACEMENT Right 08/02/2022   Procedure: CYSTOSCOPY/URETEROSCOPY/HOLMIUM LASER/STENT PLACEMENT;  Surgeon: Riki Altes, MD;  Location: [facility];  Service: Urology;  Laterality: Right;   HAMMER TOE SURGERY     HERNIA REPAIR     TONSILLECTOMY     TUBAL LIGATION      Prior to Admission medications   Medication Sig Start Date End Date Taking? Authorizing Provider  cephALEXin (KEFLEX) 250 MG capsule Take 1 capsule (250 mg total) by mouth daily. 07/03/23  Yes McGowan, Carollee Herter A, PA-C  lisinopril-hydrochlorothiazide (PRINZIDE,ZESTORETIC) 20-12.5 MG tablet Take 1 tablet by mouth daily.   Yes [provider]  venlafaxine XR (EFFEXOR-XR) 37.5 MG 24 hr capsule Take 150 mg by mouth daily. 02/05/20  Yes [provider]  rOPINIRole (REQUIP) 0.25 MG tablet Take by mouth. Take 1 tablet (0.25 mg total) by mouth at bedtime Start 1 tab nightly. Can increase by 1 tab every 5-7 nights if needed Patient not taking: Reported  on 10/23/2023 06/06/23   [provider]  tamsulosin (FLOMAX) 0.4 MG CAPS capsule Take 1 capsule (0.4 mg total) by mouth daily. Patient not taking: Reported on 10/23/2023 09/01/23   Riki Altes, MD    Allergies as of 08/17/2023 - Review Complete 07/03/2023  Allergen Reaction Noted   Septra [sulfamethoxazole-trimethoprim] Other (See Comments) 04/07/2023   Amoxicillin Diarrhea 03/02/2016   Amoxicillin-pot clavulanate Other (See Comments) 02/02/2016   Ivp dye [iodinated contrast media] Rash 04/05/2016    Family History  Problem Relation Age of Onset   Breast cancer Mother 85    Social History   Socioeconomic History   Marital status: Married    Spouse name: Not on file   Number of children: Not on file   Years of education: Not on file   Highest education level: Not on file  Occupational History   Not on file  Tobacco Use   Smoking status: Every Day    Current packs/day: 1.00    Average packs/day: 1 pack/day for 25.9 years (25.9 ttl pk-yrs)    Types: Cigarettes    Start date: 1999   Smokeless tobacco: Never  Vaping Use   Vaping status: Never Used  Substance and Sexual Activity   Alcohol use: Not Currently    Comment: GLASS OF WINE AT BEDTIME   Drug use: No   Sexual activity: Yes    Birth control/protection:  None  Other Topics Concern   Not on file  Social History Narrative   Not on file   Social Determinants of Health   Financial Resource Strain: Medium Risk (01/03/2023)   [facility] from [facility], [facility]   Overall Financial Resource Strain (CARDIA)    Difficulty of Paying Living Expenses: Somewhat hard  Food Insecurity: No Food Insecurity (01/03/2023)   Received from [facility], [facility]   Hunger Vital Sign    Worried About Running Out of Food in the Last Year: Never true    Ran Out of Food in the Last Year: Never true  Transportation Needs: No Transportation Needs  (01/03/2023)   Received from [facility], [facility]   [facility] - Transportation    In the past 12 months, has lack of transportation kept you from medical appointments or from getting medications?: No    Lack of Transportation (Non-Medical): No  Physical Activity: Not on file  Stress: Not on file  Social Connections: Not on file  Intimate Partner Violence: Not on file    Review of Systems: See HPI, otherwise negative ROS  Physical Exam: BP (!) 154/75   Pulse 84   Temp (!) 97.2 F (36.2 C) (Temporal)   Ht 5\' 5"  (1.651 m)   Wt 64 kg   SpO2 96%   BMI 23.46 kg/m  General:   Alert, cooperative in NAD Head:  Normocephalic and atraumatic. Respiratory:  Normal work of breathing. Cardiovascular:  RRR  Impression/Plan: Kara Garza is here for cataract surgery.  Risks, benefits, limitations, and alternatives regarding cataract surgery have been reviewed with the patient.  Questions have been answered.  All parties agreeable.   Willey Blade, MD  10/30/2023, 10:15 AM'

## 2023-10-30 NOTE — Op Note (Signed)
OPERATIVE NOTE  Kara Garza [id number] 10/30/2023   PREOPERATIVE DIAGNOSIS:  Nuclear sclerotic cataract left eye.  H25.12   POSTOPERATIVE DIAGNOSIS:    Nuclear sclerotic cataract left eye.     PROCEDURE:  Phacoemusification with posterior chamber intraocular lens placement of the left eye   LENS:   Implant Name Type Inv. Item Serial No. Manufacturer Lot No. LRB No. Used Action  LENS IOL TECNIS EYHANCE 14.0 - [id number] Intraocular Lens LENS IOL TECNIS EYHANCE 14.0 [id number] [employer]  Left 1 Implanted      Procedure(s): CATARACT EXTRACTION PHACO AND INTRAOCULAR LENS PLACEMENT (IOC) LEFT 7.25 00:43.0 (Left)  SURGEON:  Willey Blade, MD, MPH   ANESTHESIA:  Topical with tetracaine drops augmented with 1% preservative-free intracameral lidocaine.  ESTIMATED BLOOD LOSS: <1 mL   COMPLICATIONS:  None.   DESCRIPTION OF PROCEDURE:  The patient was identified in the holding room and transported to the operating room and placed in the supine position under the operating microscope.  During the initial prepping, the patient had some nausea and was sat upright temporarily.  Some ondansetron was administered and the patient tolerated the rest of the procedure well without incident.  The left eye was identified as the operative eye and it was prepped and draped in the usual sterile ophthalmic fashion.   A 1.0 millimeter clear-corneal paracentesis was made at the 5:00 position. 0.5 ml of preservative-free 1% lidocaine with epinephrine was injected into the anterior chamber.  The anterior chamber was filled with viscoelastic.  A 2.4 millimeter keratome was used to make a near-clear corneal incision at the 2:00 position.  A curvilinear capsulorrhexis was made with a cystotome and capsulorrhexis forceps.  Balanced salt solution was used to hydrodissect and hydrodelineate the nucleus.   Phacoemulsification was then used in stop and chop fashion to remove the lens nucleus and epinucleus.  The remaining  cortex was then removed using the irrigation and aspiration handpiece. Viscoelastic was then placed into the capsular bag to distend it for lens placement.  A lens was then injected into the capsular bag.  The remaining viscoelastic was aspirated.   Wounds were hydrated with balanced salt solution.  The anterior chamber was inflated to a physiologic pressure with balanced salt solution.  Intracameral vigamox 0.1 mL undiltued was injected into the eye and a drop placed onto the ocular surface.  No wound leaks were noted.  The patient was taken to the recovery room in stable condition without complications of anesthesia or surgery  Willey Blade 10/30/2023, 10:52 AM

## 2023-11-01 ENCOUNTER — Encounter: Payer: Self-pay | Admitting: Ophthalmology

## 2023-11-07 ENCOUNTER — Encounter: Payer: Self-pay | Admitting: Ophthalmology

## 2023-11-09 NOTE — Anesthesia Preprocedure Evaluation (Addendum)
Anesthesia Evaluation  Patient identified by MRN, date of birth, ID band Patient awake    Reviewed: Allergy & Precautions, H&P , NPO status , Patient's Chart, lab work & pertinent test results  History of Anesthesia Complications (+) PONV and history of anesthetic complications  Airway Mallampati: IV  TM Distance: >3 FB Neck ROM: Full    Dental no notable dental hx. (+) Missing   Pulmonary asthma , COPD, Current Smoker   Pulmonary exam normal breath sounds clear to auscultation       Cardiovascular hypertension, Normal cardiovascular exam Rhythm:Regular Rate:Normal   ECG 07/29/22:  Sinus tachycardia Right atrial enlargement Borderline ECG When compared with ECG of 06-Feb-2020 15:12, No significant change was found    Neuro/Psych  PSYCHIATRIC DISORDERS Anxiety Depression    negative neurological ROS  negative psych ROS   GI/Hepatic negative GI ROS, Neg liver ROS,,,  Endo/Other  negative endocrine ROS    Renal/GU Renal diseasenegative Renal ROS  negative genitourinary   Musculoskeletal negative musculoskeletal ROS (+)    Abdominal   Peds negative pediatric ROS (+)  Hematology negative hematology ROS (+) Blood dyscrasia, anemia   Anesthesia Other Findings Hypertension  Anxiety Depression  Chronic kidney disease Anemia   Complication of anesthesia--"panic attack" during previous cataract surgery Oct 30, 2023 CRNA Evaristo Bury gave 2 mg versed and 50 mcg fentanyl per record. Dr. Juel Burrow anesthesiologist. There is no record of patient reporting "panic attack" at the time of the previous surgery.  On discussion today, Patient denies panic attack. States she was "told I had a panic attack," but has no memory of such.  States she was "out of it for 2 days" postop. States she is "fine with that." This is a very nice person, good historian.  Very pleasant.  PONV (postoperative nausea and vomiting)  COPD (chronic  obstructive pulmonary disease)  Asthma  Wears dentures Wears contact lenses     Reproductive/Obstetrics negative OB ROS                             Anesthesia Physical Anesthesia Plan  ASA: 3  Anesthesia Plan: MAC   Post-op Pain Management:    Induction: Intravenous  PONV Risk Score and Plan:   Airway Management Planned: Natural Airway and Nasal Cannula  Additional Equipment:   Intra-op Plan:   Post-operative Plan:   Informed Consent: I have reviewed the patients History and Physical, chart, labs and discussed the procedure including the risks, benefits and alternatives for the proposed anesthesia with the patient or authorized representative who has indicated his/her understanding and acceptance.     Dental Advisory Given  Plan Discussed with: Anesthesiologist, CRNA and Surgeon  Anesthesia Plan Comments: (Patient consented for risks of anesthesia including but not limited to:  - adverse reactions to medications - damage to eyes, teeth, lips or other oral mucosa - nerve damage due to positioning  - sore throat or hoarseness - Damage to heart, brain, nerves, lungs, other parts of body or loss of life  Patient voiced understanding and assent.)        Anesthesia Quick Evaluation

## 2023-11-10 NOTE — Discharge Instructions (Signed)

## 2023-11-13 ENCOUNTER — Other Ambulatory Visit: Payer: Self-pay

## 2023-11-13 ENCOUNTER — Encounter: Payer: Self-pay | Admitting: Ophthalmology

## 2023-11-13 ENCOUNTER — Ambulatory Visit: Payer: Medicare Other | Admitting: Anesthesiology

## 2023-11-13 ENCOUNTER — Ambulatory Visit
Admission: RE | Admit: 2023-11-13 | Discharge: 2023-11-13 | Disposition: A | Discharge status: Home / Self Care | Payer: Medicare Other | Attending: Ophthalmology | Admitting: Ophthalmology

## 2023-11-13 ENCOUNTER — Encounter
Admission: RE | Disposition: A | Discharge status: Home / Self Care | Payer: Self-pay | Source: Home / Self Care | Attending: Ophthalmology

## 2023-11-13 DIAGNOSIS — I129 Hypertensive chronic kidney disease with stage 1 through stage 4 chronic kidney disease, or unspecified chronic kidney disease: Secondary | ICD-10-CM | POA: Diagnosis not present

## 2023-11-13 DIAGNOSIS — I517 Cardiomegaly: Secondary | ICD-10-CM | POA: Insufficient documentation

## 2023-11-13 DIAGNOSIS — F419 Anxiety disorder, unspecified: Secondary | ICD-10-CM | POA: Insufficient documentation

## 2023-11-13 DIAGNOSIS — F17210 Nicotine dependence, cigarettes, uncomplicated: Secondary | ICD-10-CM | POA: Diagnosis not present

## 2023-11-13 DIAGNOSIS — J449 Chronic obstructive pulmonary disease, unspecified: Secondary | ICD-10-CM | POA: Diagnosis not present

## 2023-11-13 DIAGNOSIS — H2511 Age-related nuclear cataract, right eye: Secondary | ICD-10-CM | POA: Insufficient documentation

## 2023-11-13 DIAGNOSIS — F32A Depression, unspecified: Secondary | ICD-10-CM | POA: Insufficient documentation

## 2023-11-13 DIAGNOSIS — D649 Anemia, unspecified: Secondary | ICD-10-CM | POA: Diagnosis not present

## 2023-11-13 DIAGNOSIS — N189 Chronic kidney disease, unspecified: Secondary | ICD-10-CM | POA: Diagnosis not present

## 2023-11-13 HISTORY — PX: CATARACT EXTRACTION W/PHACO: SHX586

## 2023-11-13 SURGERY — PHACOEMULSIFICATION, CATARACT, WITH IOL INSERTION
Anesthesia: Monitor Anesthesia Care | Site: Eye | Laterality: Right

## 2023-11-13 MED ORDER — DEXMEDETOMIDINE HCL IN NACL 200 MCG/50ML IV SOLN
INTRAVENOUS | Status: DC | PRN
  Administered 2023-11-13: 8 ug via INTRAVENOUS

## 2023-11-13 MED ORDER — MOXIFLOXACIN HCL 0.5 % OP SOLN
OPHTHALMIC | Status: DC | PRN
  Administered 2023-11-13: .2 mL via OPHTHALMIC

## 2023-11-13 MED ORDER — TETRACAINE HCL 0.5 % OP SOLN
1.0000 [drp] | OPHTHALMIC | Status: DC | PRN
  Administered 2023-11-13 (×3): 1 [drp] via OPHTHALMIC

## 2023-11-13 MED ORDER — SIGHTPATH DOSE#1 BSS IO SOLN
INTRAOCULAR | Status: DC | PRN
  Administered 2023-11-13: 15 mL

## 2023-11-13 MED ORDER — DEXMEDETOMIDINE HCL IN NACL 80 MCG/20ML IV SOLN
INTRAVENOUS | Status: AC
  Filled 2023-11-13: qty 20

## 2023-11-13 MED ORDER — TETRACAINE HCL 0.5 % OP SOLN
OPHTHALMIC | Status: AC
Start: 2023-11-13 — End: ?
  Filled 2023-11-13: qty 4

## 2023-11-13 MED ORDER — MIDAZOLAM HCL 2 MG/2ML IJ SOLN
INTRAMUSCULAR | Status: AC
  Filled 2023-11-13: qty 2

## 2023-11-13 MED ORDER — ARMC OPHTHALMIC DILATING DROPS
OPHTHALMIC | Status: AC
  Filled 2023-11-13: qty 0.5

## 2023-11-13 MED ORDER — ARMC OPHTHALMIC DILATING DROPS
1.0000 | OPHTHALMIC | Status: DC | PRN
  Administered 2023-11-13 (×3): 1 via OPHTHALMIC

## 2023-11-13 MED ORDER — SIGHTPATH DOSE#1 BSS IO SOLN
INTRAOCULAR | Status: DC | PRN
  Administered 2023-11-13: 88 mL via OPHTHALMIC

## 2023-11-13 MED ORDER — SIGHTPATH DOSE#1 NA HYALUR & NA CHOND-NA HYALUR IO KIT
PACK | INTRAOCULAR | Status: DC | PRN
  Administered 2023-11-13: 1 via OPHTHALMIC

## 2023-11-13 MED ORDER — LIDOCAINE HCL (PF) 2 % IJ SOLN
INTRAOCULAR | Status: DC | PRN
  Administered 2023-11-13: 1 mL via INTRAOCULAR

## 2023-11-13 SURGICAL SUPPLY — 11 items
CATARACT SUITE SIGHTPATH (MISCELLANEOUS) ×1
DISSECTOR HYDRO NUCLEUS 50X22 (MISCELLANEOUS) ×1 IMPLANT
FEE CATARACT SUITE SIGHTPATH (MISCELLANEOUS) ×1 IMPLANT
GLOVE PI ULTRA LF STRL 7.5 (GLOVE) ×1 IMPLANT
GLOVE SURG POLYISOPRENE 8.5 (GLOVE) ×1
GLOVE SURG SYN 8.5 PF PI BL (GLOVE) ×1 IMPLANT
LENS IOL TECNIS EYHANCE 18.0 (Intraocular Lens) IMPLANT
NDL FILTER BLUNT 18X1 1/2 (NEEDLE) ×1 IMPLANT
NEEDLE FILTER BLUNT 18X1 1/2 (NEEDLE) ×1
SYR 3ML LL SCALE MARK (SYRINGE) ×1 IMPLANT
SYR 5ML LL (SYRINGE) ×1 IMPLANT

## 2023-11-13 NOTE — Transfer of Care (Signed)
Immediate Anesthesia Transfer of Care Note  Patient: Kara Garza  Procedure(s) Performed: CATARACT EXTRACTION PHACO AND INTRAOCULAR LENS PLACEMENT (IOC) RIGHT  4.82  00:32.4 (Right: Eye)  Patient Location: PACU  Anesthesia Type: MAC  Level of Consciousness: awake, alert  and patient cooperative  Airway and Oxygen Therapy: Patient Spontanous Breathing and Patient connected to supplemental oxygen  Post-op Assessment: Post-op Vital signs reviewed, Patient's Cardiovascular Status Stable, Respiratory Function Stable, Patent Airway and No signs of Nausea or vomiting  Post-op Vital Signs: Reviewed and stable  Complications: No notable events documented.

## 2023-11-13 NOTE — H&P (Signed)
Kara Garza   Primary Care Physician:  Dorothey Baseman, MD Ophthalmologist: Dr. Willey Blade  Pre-Procedure History & Physical: HPI:  Kara Garza is a 77 y.o. female here for cataract surgery.   Past Medical History:  Diagnosis Date   Anemia    H/O   Anxiety    Asthma    PT DENIES BUT PCP HAS LISTED ON H&P   Chronic kidney disease    H/O KIDNEY STONES   Complication of anesthesia    "panic attack" during anesthesia for 1st cataract   COPD (chronic obstructive pulmonary disease) (HCC)    PT DENIES BUT PCP HAS LISTED ON H&P   Depression    Hypertension    PONV (postoperative nausea and vomiting)    NAUSEATED   Wears contact lenses    Wears dentures    partial upper, full lower    Past Surgical History:  Procedure Laterality Date   BREAST BIOPSY Right yrs ago   benign   CATARACT EXTRACTION W/PHACO Left 10/30/2023   Procedure: CATARACT EXTRACTION PHACO AND INTRAOCULAR LENS PLACEMENT (IOC) LEFT 7.25 00:43.0;  Surgeon: Nevada Crane, MD;  Location: [facility];  Service: Ophthalmology;  Laterality: Left;   CYSTOSCOPY MACROPLASTIQUE IMPLANT N/A 04/12/2016   Procedure: CYSTOSCOPY MACROPLASTIQUE IMPLANT;  Surgeon: Orson Ape, MD;  Location: [facility];  Service: Urology;  Laterality: N/A;   CYSTOSCOPY/URETEROSCOPY/HOLMIUM LASER/STENT PLACEMENT Right 08/02/2022   Procedure: CYSTOSCOPY/URETEROSCOPY/HOLMIUM LASER/STENT PLACEMENT;  Surgeon: Riki Altes, MD;  Location: [facility];  Service: Urology;  Laterality: Right;   HAMMER TOE SURGERY     HERNIA REPAIR     TONSILLECTOMY     TUBAL LIGATION      Prior to Admission medications   Medication Sig Start Date End Date Taking? Authorizing Provider  cephALEXin (KEFLEX) 250 MG capsule Take 1 capsule (250 mg total) by mouth daily. 07/03/23  Yes McGowan, Carollee Herter A, PA-C  lisinopril-hydrochlorothiazide (PRINZIDE,ZESTORETIC) 20-12.5 MG tablet Take 1 tablet by mouth daily.   Yes [provider]  venlafaxine  XR (EFFEXOR-XR) 37.5 MG 24 hr capsule Take 150 mg by mouth daily. 02/05/20  Yes [provider]  rOPINIRole (REQUIP) 0.25 MG tablet Take by mouth. Take 1 tablet (0.25 mg total) by mouth at bedtime Start 1 tab nightly. Can increase by 1 tab every 5-7 nights if needed Patient not taking: Reported on 10/23/2023 06/06/23   [provider]  tamsulosin (FLOMAX) 0.4 MG CAPS capsule Take 1 capsule (0.4 mg total) by mouth daily. Patient not taking: Reported on 10/23/2023 09/01/23   Riki Altes, MD    Allergies as of 08/17/2023 - Review Complete 07/03/2023  Allergen Reaction Noted   Septra [sulfamethoxazole-trimethoprim] Other (See Comments) 04/07/2023   Amoxicillin Diarrhea 03/02/2016   Amoxicillin-pot clavulanate Other (See Comments) 02/02/2016   Ivp dye [iodinated contrast media] Rash 04/05/2016    Family History  Problem Relation Age of Onset   Breast cancer Mother 42    Social History   Socioeconomic History   Marital status: Married    Spouse name: Not on file   Number of children: Not on file   Years of education: Not on file   Highest education level: Not on file  Occupational History   Not on file  Tobacco Use   Smoking status: Every Day    Current packs/day: 1.00    Average packs/day: 1 pack/day for 25.9 years (25.9 ttl pk-yrs)    Types: Cigarettes    Start date: 1999  Smokeless tobacco: Never  Vaping Use   Vaping status: Never Used  Substance and Sexual Activity   Alcohol use: Not Currently    Comment: GLASS OF WINE AT BEDTIME   Drug use: No   Sexual activity: Yes    Birth control/protection: None  Other Topics Concern   Not on file  Social History Narrative   Not on file   Social Determinants of Health   Financial Resource Strain: Medium Risk (01/03/2023)   Received from [facility], [facility]   Overall Financial Resource Strain (CARDIA)    Difficulty of Paying Living Expenses: Somewhat hard  Food  Insecurity: No Food Insecurity (01/03/2023)   Received from [facility], [facility]   Hunger Vital Sign    Worried About Running Out of Food in the Last Year: Never true    Ran Out of Food in the Last Year: Never true  Transportation Needs: No Transportation Needs (01/03/2023)   Received from [facility], [facility]   [facility] - Transportation    In the past 12 months, has lack of transportation kept you from medical appointments or from getting medications?: No    Lack of Transportation (Non-Medical): No  Physical Activity: Not on file  Stress: Not on file  Social Connections: Not on file  Intimate Partner Violence: Not on file    Review of Systems: See HPI, otherwise negative ROS  Physical Exam: BP 138/67   Temp 97.8 F (36.6 C) (Temporal)   Resp 15   Ht 5\' 5"  (1.651 m)   Wt 64.7 kg   SpO2 99%   BMI 23.75 kg/m  General:   Alert, cooperative in NAD Head:  Normocephalic and atraumatic. Respiratory:  Normal work of breathing. Cardiovascular:  RRR  Impression/Plan: Kara Garza is here for cataract surgery.  Risks, benefits, limitations, and alternatives regarding cataract surgery have been reviewed with the patient.  Questions have been answered.  All parties agreeable.   Willey Blade, MD  11/13/2023, 9:52 AM

## 2023-11-13 NOTE — Anesthesia Postprocedure Evaluation (Signed)
Anesthesia Post Note  Patient: Kara Garza  Procedure(s) Performed: CATARACT EXTRACTION PHACO AND INTRAOCULAR LENS PLACEMENT (IOC) RIGHT  4.82  00:32.4 (Right: Eye)  Patient location during evaluation: PACU Anesthesia Type: MAC Level of consciousness: awake and alert Pain management: pain level controlled Vital Signs Assessment: post-procedure vital signs reviewed and stable Respiratory status: spontaneous breathing, nonlabored ventilation, respiratory function stable and patient connected to nasal cannula oxygen Cardiovascular status: stable and blood pressure returned to baseline Postop Assessment: no apparent nausea or vomiting Anesthetic complications: no   No notable events documented.   Last Vitals:  Vitals:   11/13/23 1015 11/13/23 1024  BP: 121/66 (!) 118/57  Pulse: 79 73  Resp: 11 14  Temp: (!) 36.1 C (!) 36.1 C  SpO2: 100% 99%    Last Pain:  Vitals:   11/13/23 1024  TempSrc:   PainSc: 0-No pain                 Nashla Althoff C Shaquisha Wynn

## 2023-11-13 NOTE — Op Note (Signed)
OPERATIVE NOTE  SABA SOBERON [id number] 11/13/2023   PREOPERATIVE DIAGNOSIS:  Nuclear sclerotic cataract right eye.  H25.11   POSTOPERATIVE DIAGNOSIS:    Nuclear sclerotic cataract right eye.     PROCEDURE:  Phacoemusification with posterior chamber intraocular lens placement of the right eye   LENS:   Implant Name Type Inv. Item Serial No. Manufacturer Lot No. LRB No. Used Action  LENS IOL TECNIS EYHANCE 18.0 - [id number] Intraocular Lens LENS IOL TECNIS EYHANCE 18.0 [id number] [employer]  Right 1 Implanted       Procedure(s): CATARACT EXTRACTION PHACO AND INTRAOCULAR LENS PLACEMENT (IOC) RIGHT  4.82  00:32.4 (Right)  SURGEON:  Willey Blade, MD, MPH  ANESTHESIOLOGIST: Anesthesiologist: Marisue Humble, MD CRNA: Emeterio Reeve, CRNA   ANESTHESIA:  Topical with tetracaine drops augmented with 1% preservative-free intracameral lidocaine.  ESTIMATED BLOOD LOSS: less than 1 mL.   COMPLICATIONS:  None.   DESCRIPTION OF PROCEDURE:  The patient was identified in the holding room and transported to the operating room and placed in the supine position under the operating microscope.  The right eye was identified as the operative eye and it was prepped and draped in the usual sterile ophthalmic fashion.   A 1.0 millimeter clear-corneal paracentesis was made at the 10:30 position. 0.5 ml of preservative-free 1% lidocaine with epinephrine was injected into the anterior chamber.  The anterior chamber was filled with viscoelastic.  A 2.4 millimeter keratome was used to make a near-clear corneal incision at the 8:00 position.  A curvilinear capsulorrhexis was made with a cystotome and capsulorrhexis forceps.  Balanced salt solution was used to hydrodissect and hydrodelineate the nucleus.   Phacoemulsification was then used in stop and chop fashion to remove the lens nucleus and epinucleus.  The remaining cortex was then removed using the irrigation and aspiration handpiece. Viscoelastic was  then placed into the capsular bag to distend it for lens placement.  A lens was then injected into the capsular bag.  The remaining viscoelastic was aspirated.   Wounds were hydrated with balanced salt solution.  The anterior chamber was inflated to a physiologic pressure with balanced salt solution.   Intracameral vigamox 0.1 mL undiluted was injected into the eye and a drop placed onto the ocular surface.  No wound leaks were noted.  The patient was taken to the recovery room in stable condition without complications of anesthesia or surgery  Willey Blade 11/13/2023, 10:18 AM

## 2023-11-14 ENCOUNTER — Encounter: Payer: Self-pay | Admitting: Ophthalmology

## 2023-11-28 ENCOUNTER — Ambulatory Visit
Admission: EM | Admit: 2023-11-28 | Discharge: 2023-11-28 | Disposition: A | Discharge status: Home / Self Care | Payer: Medicare Other

## 2023-11-28 ENCOUNTER — Encounter: Payer: Self-pay | Admitting: Emergency Medicine

## 2023-11-28 DIAGNOSIS — F17200 Nicotine dependence, unspecified, uncomplicated: Secondary | ICD-10-CM | POA: Insufficient documentation

## 2023-11-28 DIAGNOSIS — J440 Chronic obstructive pulmonary disease with acute lower respiratory infection: Secondary | ICD-10-CM | POA: Diagnosis not present

## 2023-11-28 DIAGNOSIS — R051 Acute cough: Secondary | ICD-10-CM

## 2023-11-28 DIAGNOSIS — J209 Acute bronchitis, unspecified: Secondary | ICD-10-CM

## 2023-11-28 MED ORDER — AZITHROMYCIN 250 MG PO TABS: ORAL_TABLET | ORAL | 0 refills | Status: DC

## 2023-11-28 MED ORDER — PROMETHAZINE-DM 6.25-15 MG/5ML PO SYRP: 5.0000 mL | ORAL_SOLUTION | Freq: Four times a day (QID) | ORAL | 0 refills | Status: DC | PRN

## 2023-11-28 MED ORDER — PREDNISONE 50 MG PO TABS: ORAL_TABLET | ORAL | 0 refills | Status: DC

## 2023-11-28 NOTE — ED Provider Notes (Signed)
[facility]    CSN: [id number] Arrival date & time: 11/28/23  1509      History   Chief Complaint Chief Complaint  Patient presents with   Sore Throat   Cough   chest congestion     HPI Kara Garza is a 77 y.o. female.   77 year old female, Kara Garza, presents to urgent care for evaluation of cough, congestion, chest tightness for 4 days.  Patient states that she has been taking Mucinex for her symptoms without relief.  Patient with a past medical history of COPD, reports still smokes states that when she has a flareup like this she " has to have a Z-Pak and prednisone", patient requesting codeine cough medicine.  The history is provided by the patient. No language interpreter was used.    Past Medical History:  Diagnosis Date   Anemia    H/O   Anxiety    Asthma    PT DENIES BUT PCP HAS LISTED ON H&P   Chronic kidney disease    H/O KIDNEY STONES   Complication of anesthesia    "panic attack" during anesthesia for 1st cataract   COPD (chronic obstructive pulmonary disease) (HCC)    PT DENIES BUT PCP HAS LISTED ON H&P   Depression    Hypertension    PONV (postoperative nausea and vomiting)    NAUSEATED   Wears contact lenses    Wears dentures    partial upper, full lower    Patient Active Problem List   Diagnosis Date Noted   COPD (chronic obstructive pulmonary disease) with acute bronchitis (HCC) 11/28/2023   Acute cough 11/28/2023   Smoker 04/27/2020   Anxiety 12/02/2015    Past Surgical History:  Procedure Laterality Date   BREAST BIOPSY Right yrs ago   benign   CATARACT EXTRACTION W/PHACO Left 10/30/2023   Procedure: CATARACT EXTRACTION PHACO AND INTRAOCULAR LENS PLACEMENT (IOC) LEFT 7.25 00:43.0;  Surgeon: Nevada Crane, MD;  Location: [facility];  Service: Ophthalmology;  Laterality: Left;   CATARACT EXTRACTION W/PHACO Right 11/13/2023   Procedure: CATARACT EXTRACTION PHACO AND INTRAOCULAR LENS PLACEMENT (IOC) RIGHT  4.82   00:32.4;  Surgeon: Nevada Crane, MD;  Location: [facility];  Service: Ophthalmology;  Laterality: Right;   CYSTOSCOPY MACROPLASTIQUE IMPLANT N/A 04/12/2016   Procedure: CYSTOSCOPY MACROPLASTIQUE IMPLANT;  Surgeon: Orson Ape, MD;  Location: [facility];  Service: Urology;  Laterality: N/A;   CYSTOSCOPY/URETEROSCOPY/HOLMIUM LASER/STENT PLACEMENT Right 08/02/2022   Procedure: CYSTOSCOPY/URETEROSCOPY/HOLMIUM LASER/STENT PLACEMENT;  Surgeon: Riki Altes, MD;  Location: [facility];  Service: Urology;  Laterality: Right;   HAMMER TOE SURGERY     HERNIA REPAIR     TONSILLECTOMY     TUBAL LIGATION      OB History   No obstetric history on file.      Home Medications    Prior to Admission medications   Medication Sig Start Date End Date Taking? Authorizing Provider  azithromycin (ZITHROMAX Z-PAK) 250 MG tablet Take as package directed 11/28/23  Yes Kala Ambriz, Para March, NP  gabapentin (NEURONTIN) 300 MG capsule Take 1 capsule by mouth 3 (three) times daily. 11/21/23 11/20/24 Yes [provider]  predniSONE (DELTASONE) 50 MG tablet Take 1 tab po daily at breakfast 11/28/23  Yes Mckaela Howley, Para March, NP  promethazine-dextromethorphan (PROMETHAZINE-DM) 6.25-15 MG/5ML syrup Take 5 mLs by mouth 4 (four) times daily as needed for cough. 11/28/23  Yes Nazarene Bunning, Para March, NP  Vitamin D, Ergocalciferol, 50000 units CAPS Take by mouth daily.  07/27/23  Yes [provider]  cephALEXin (KEFLEX) 250 MG capsule Take 1 capsule (250 mg total) by mouth daily. 07/03/23   McGowan, Wellington Hampshire, PA-C  lisinopril-hydrochlorothiazide (PRINZIDE,ZESTORETIC) 20-12.5 MG tablet Take 1 tablet by mouth daily.    [provider]  rOPINIRole (REQUIP) 0.25 MG tablet Take by mouth. Take 1 tablet (0.25 mg total) by mouth at bedtime Start 1 tab nightly. Can increase by 1 tab every 5-7 nights if needed Patient not taking: Reported on 10/23/2023 06/06/23   [provider]  tamsulosin (FLOMAX)  0.4 MG CAPS capsule Take 1 capsule (0.4 mg total) by mouth daily. Patient not taking: Reported on 10/23/2023 09/01/23   Riki Altes, MD  venlafaxine XR (EFFEXOR-XR) 37.5 MG 24 hr capsule Take 150 mg by mouth daily. 02/05/20   [provider]    Family History Family History  Problem Relation Age of Onset   Breast cancer Mother 11    Social History Social History   Tobacco Use   Smoking status: Every Day    Current packs/day: 1.00    Average packs/day: 1 pack/day for 25.9 years (25.9 ttl pk-yrs)    Types: Cigarettes    Start date: 1999   Smokeless tobacco: Never  Vaping Use   Vaping status: Never Used  Substance Use Topics   Alcohol use: Not Currently    Comment: GLASS OF WINE AT BEDTIME   Drug use: No     Allergies   Septra [sulfamethoxazole-trimethoprim], Amoxicillin, Amoxicillin-pot clavulanate, and Ivp dye [iodinated contrast media]   Review of Systems Review of Systems  HENT:  Positive for congestion.   Respiratory:  Positive for cough and chest tightness.   All other systems reviewed and are negative.    Physical Exam Triage Vital Signs ED Triage Vitals  Encounter Vitals Group     BP 11/28/23 1553 (!) 140/62     Systolic BP Percentile --      Diastolic BP Percentile --      Pulse Rate 11/28/23 1553 84     Resp 11/28/23 1553 16     Temp 11/28/23 1553 98.2 F (36.8 C)     Temp Source 11/28/23 1553 Oral     SpO2 11/28/23 1553 97 %     Weight --      Height --      Head Circumference --      Peak Flow --      Pain Score 11/28/23 1552 0     Pain Loc --      Pain Education --      Exclude from Growth Chart --    No data found.  Updated Vital Signs BP (!) 140/62 (BP Location: Left Arm)   Pulse 84   Temp 98.2 F (36.8 C) (Oral)   Resp 16   SpO2 97%   Visual Acuity Right Eye Distance:   Left Eye Distance:   Bilateral Distance:    Right Eye Near:   Left Eye Near:    Bilateral Near:     Physical Exam Vitals and nursing note  reviewed.  Constitutional:      General: She is not in acute distress.    Appearance: She is well-developed and well-groomed.  HENT:     Head: Normocephalic and atraumatic.  Eyes:     Conjunctiva/sclera: Conjunctivae normal.  Cardiovascular:     Rate and Rhythm: Normal rate and regular rhythm.     Heart sounds: No murmur heard. Pulmonary:  Effort: Pulmonary effort is normal. No respiratory distress.     Breath sounds: Normal breath sounds.  Abdominal:     Palpations: Abdomen is soft.     Tenderness: There is no abdominal tenderness.  Musculoskeletal:        General: No swelling.     Cervical back: Neck supple.  Skin:    General: Skin is warm and dry.     Capillary Refill: Capillary refill takes less than 2 seconds.  Neurological:     General: No focal deficit present.     Mental Status: She is alert and oriented to person, place, and time.     GCS: GCS eye subscore is 4. GCS verbal subscore is 5. GCS motor subscore is 6.     Cranial Nerves: No cranial nerve deficit.     Sensory: No sensory deficit.  Psychiatric:        Attention and Perception: Attention normal.        Mood and Affect: Mood normal.        Speech: Speech normal.        Behavior: Behavior is cooperative.      UC Treatments / Results  Labs (all labs ordered are listed, but only abnormal results are displayed) Labs Reviewed - No data to display  EKG   Radiology No results found.  Procedures Procedures (including critical care time)  Medications Ordered in UC Medications - No data to display  Initial Impression / Assessment and Plan / UC Course  I have reviewed the triage vital signs and the nursing notes.  Pertinent labs & imaging results that were available during my care of the patient were reviewed by me and considered in my medical decision making (see chart for details).    Discussed exam findings and plan of care with patient, patient with COPD flare, will treat with prednisone,  Phenergan dm cough medicine and Z-Pak.  Strict go to ER precautions given , patient verbalized understanding to this provider.  Ddx: COPD, acute cough, smoker Final Clinical Impressions(s) / UC Diagnoses   Final diagnoses:  COPD (chronic obstructive pulmonary disease) with acute bronchitis (HCC)  Acute cough  Smoker     Discharge Instructions      Take medication as directed, drowsiness with cough medicine, drink plenty of fluids, stop smoking.  Follow-up with PCP, go to ER for new or worsening issues or concerns.    ED Prescriptions     Medication Sig Dispense Auth. Provider   promethazine-dextromethorphan (PROMETHAZINE-DM) 6.25-15 MG/5ML syrup Take 5 mLs by mouth 4 (four) times daily as needed for cough. 118 mL Wynn Alldredge, NP   predniSONE (DELTASONE) 50 MG tablet Take 1 tab po daily at breakfast 5 tablet Kerie Badger, Para March, NP   azithromycin (ZITHROMAX Z-PAK) 250 MG tablet Take as package directed 6 tablet Darra Rosa, Para March, NP      PDMP not reviewed this encounter.   Clancy Gourd, NP 11/28/23 2107

## 2023-11-28 NOTE — Discharge Instructions (Signed)
Take medication as directed, drowsiness with cough medicine, drink plenty of fluids, stop smoking.  Follow-up with PCP, go to ER for new or worsening issues or concerns.

## 2023-11-28 NOTE — ED Triage Notes (Signed)
Pt c/o cough, congestion, chest tightness, x 4 days. Pt has taken Mucinex for her symptoms.

## 2023-12-01 ENCOUNTER — Telehealth: Payer: Self-pay | Admitting: Urology

## 2023-12-01 NOTE — Telephone Encounter (Signed)
Patient called regarding prescription for Cephalexin. She is asking if this is supposed to be continued as a maintenance medication, or if she does not need to take it any longer. If she needs continue taking, she will need a refill, and would like 90 day supply. Please advise. [facility] in [place].

## 2023-12-01 NOTE — Telephone Encounter (Signed)
.  left message to have patient return my call.  

## 2023-12-06 NOTE — Telephone Encounter (Signed)
Pt would like to continue the Keflex, ok to refill?

## 2023-12-07 ENCOUNTER — Other Ambulatory Visit: Payer: Self-pay | Admitting: *Deleted

## 2023-12-07 MED ORDER — CEPHALEXIN 250 MG PO CAPS: 250.0000 mg | ORAL_CAPSULE | Freq: Every day | ORAL | 0 refills | Status: DC

## 2023-12-07 NOTE — Telephone Encounter (Signed)
Notified patient as instructed, patient pleased. Discussed follow-up appointments, patient agrees  

## 2023-12-07 NOTE — Telephone Encounter (Signed)
Okay to refill.  She will need an annual follow-up appointment September 2025.  Please schedule

## 2023-12-21 ENCOUNTER — Other Ambulatory Visit: Payer: Self-pay | Admitting: Obstetrics and Gynecology

## 2023-12-21 DIAGNOSIS — N950 Postmenopausal bleeding: Secondary | ICD-10-CM

## 2024-01-03 ENCOUNTER — Ambulatory Visit
Admission: RE | Admit: 2024-01-03 | Discharge: 2024-01-03 | Disposition: A | Discharge status: Home / Self Care | Payer: Medicare Other | Source: Ambulatory Visit | Attending: Obstetrics and Gynecology | Admitting: Obstetrics and Gynecology

## 2024-01-03 DIAGNOSIS — N950 Postmenopausal bleeding: Secondary | ICD-10-CM

## 2024-03-02 ENCOUNTER — Other Ambulatory Visit: Payer: Self-pay | Admitting: Urology

## 2024-03-04 ENCOUNTER — Other Ambulatory Visit: Payer: Self-pay | Admitting: Urology

## 2024-03-05 MED ORDER — CEPHALEXIN 250 MG PO CAPS: 250.0000 mg | ORAL_CAPSULE | Freq: Every day | ORAL | 3 refills | Status: DC

## 2024-03-05 NOTE — Addendum Note (Signed)
 Addended by: Consuella Lose on: 03/05/2024 02:08 PM   Modules accepted: Orders

## 2024-03-05 NOTE — Telephone Encounter (Signed)
 Patient called to follow up on refill of Keflex, original request was denied. Per LOV Dr Lonna Cobb stated for patient to continue taking Keflex prophylactic. Refills sent in. Patient advised and advised to keep her annual follow up appointment in September.

## 2024-09-06 ENCOUNTER — Ambulatory Visit: Payer: Self-pay | Admitting: Urology

## 2024-09-11 ENCOUNTER — Ambulatory Visit (INDEPENDENT_AMBULATORY_CARE_PROVIDER_SITE_OTHER): Admitting: Urology

## 2024-09-11 ENCOUNTER — Encounter: Payer: Self-pay | Admitting: Urology

## 2024-09-11 VITALS — BP 156/74 | HR 101 | Ht 65.0 in | Wt 160.0 lb

## 2024-09-11 DIAGNOSIS — N390 Urinary tract infection, site not specified: Secondary | ICD-10-CM

## 2024-09-11 DIAGNOSIS — N200 Calculus of kidney: Secondary | ICD-10-CM | POA: Diagnosis not present

## 2024-09-11 MED ORDER — CEPHALEXIN 250 MG PO CAPS: 250.0000 mg | ORAL_CAPSULE | Freq: Every day | ORAL | 3 refills | Status: DC

## 2024-09-11 NOTE — Progress Notes (Signed)
 09/11/2024 3:28 PM   Leita DEL Rude 1946-07-07 [id number]  Referring provider: Glover Lenis, MD [phone] [place],  [place] [address]  Chief Complaint  Patient presents with   Other   Urologic History: 1. Personal history of urinary calculi Ureteroscopic removal of a right distal ureteral calculus 08/02/22.  Stone analysis CaOx mono/CaOx di 80/20.  CT 08/2023 2 mm nonobstructing left renal calculus   2. Recurrent UTI  Low-dose prophylaxis cephalexin  250 mg daily   HPI: Kara Garza is a 78 y.o. female presents for annual follow-up.  No UTIs since last year's visit No flank, abdominal or pelvic pain Denies dysuria, gross hematuria   PMH: Past Medical History:  Diagnosis Date   Anemia    H/O   Anxiety    Asthma    PT DENIES BUT PCP HAS LISTED ON H&P   Chronic kidney disease    H/O KIDNEY STONES   Complication of anesthesia    panic attack during anesthesia for 1st cataract   COPD (chronic obstructive pulmonary disease) (HCC)    PT DENIES BUT PCP HAS LISTED ON H&P   Depression    Hypertension    PONV (postoperative nausea and vomiting)    NAUSEATED   Wears contact lenses    Wears dentures    partial upper, full lower    Surgical History: Past Surgical History:  Procedure Laterality Date   BREAST BIOPSY Right yrs ago   benign   CATARACT EXTRACTION W/PHACO Left 10/30/2023   Procedure: CATARACT EXTRACTION PHACO AND INTRAOCULAR LENS PLACEMENT (IOC) LEFT 7.25 00:43.0;  Surgeon: Myrna Adine Anes, MD;  Location: [facility];  Service: Ophthalmology;  Laterality: Left;   CATARACT EXTRACTION W/PHACO Right 11/13/2023   Procedure: CATARACT EXTRACTION PHACO AND INTRAOCULAR LENS PLACEMENT (IOC) RIGHT  4.82  00:32.4;  Surgeon: Myrna Adine Anes, MD;  Location: [facility];  Service: Ophthalmology;  Laterality: Right;   CYSTOSCOPY MACROPLASTIQUE IMPLANT N/A 04/12/2016   Procedure: CYSTOSCOPY MACROPLASTIQUE IMPLANT;  Surgeon: Ozell JONELLE Burkes, MD;   Location: [facility] OR[facility];  Service: Urology;  Laterality: N/A;   CYSTOSCOPY/URETEROSCOPY/HOLMIUM LASER/STENT PLACEMENT Right 08/02/2022   Procedure: CYSTOSCOPY/URETEROSCOPY/HOLMIUM LASER/STENT PLACEMENT;  Surgeon: Twylla Glendia BROCKS, MD;  Location: [facility];  Service: Urology;  Laterality: Right;   HAMMER TOE SURGERY     HERNIA REPAIR     TONSILLECTOMY     TUBAL LIGATION      Home Medications:  Allergies as of 09/11/2024       Reactions   Septra  [sulfamethoxazole -trimethoprim ] Other (See Comments)   severe nausea, terrible indigestion/heartburn, diarrhea, vomited a few times, sick to her stomach, breaking out in sweats and weakness/fatigue.   Amoxicillin  Diarrhea   Amoxicillin -pot Clavulanate Other (See Comments)   Causes GI issues   Ivp Dye [iodinated Contrast Media] Rash        Medication List        Accurate as of September 11, 2024  3:28 PM. If you have any questions, ask your nurse or doctor.          azithromycin  250 MG tablet Commonly known as: Zithromax  Z-Pak Take as package directed   cephALEXin  250 MG capsule Commonly known as: KEFLEX  Take 1 capsule (250 mg total) by mouth daily.   gabapentin 300 MG capsule Commonly known as: NEURONTIN Take 1 capsule by mouth 3 (three) times daily.   lisinopril-hydrochlorothiazide 20-12.5 MG tablet Commonly known as: ZESTORETIC Take 1 tablet by mouth daily.   predniSONE  50 MG tablet Commonly known  as: DELTASONE  Take 1 tab po daily at breakfast   promethazine -dextromethorphan 6.25-15 MG/5ML syrup Commonly known as: PROMETHAZINE -DM Take 5 mLs by mouth 4 (four) times daily as needed for cough.   rOPINIRole 0.25 MG tablet Commonly known as: REQUIP Take by mouth. Take 1 tablet (0.25 mg total) by mouth at bedtime Start 1 tab nightly. Can increase by 1 tab every 5-7 nights if needed   tamsulosin  0.4 MG Caps capsule Commonly known as: FLOMAX  Take 1 capsule (0.4 mg total) by mouth daily.   venlafaxine XR 37.5 MG 24 hr  capsule Commonly known as: EFFEXOR-XR Take 150 mg by mouth daily.   Vitamin D (Ergocalciferol) 50000 units Caps Take by mouth daily.        Allergies:  Allergies  Allergen Reactions   Septra  [Sulfamethoxazole -Trimethoprim ] Other (See Comments)    severe nausea, terrible indigestion/heartburn, diarrhea, vomited a few times, sick to her stomach, breaking out in sweats and weakness/fatigue.   Amoxicillin  Diarrhea   Amoxicillin -Pot Clavulanate Other (See Comments)    Causes GI issues   Ivp Dye [Iodinated Contrast Media] Rash    Family History: Family History  Problem Relation Age of Onset   Breast cancer Mother 8    Social History:  reports that she has been smoking cigarettes. She started smoking about 26 years ago. She has a 26.7 pack-year smoking history. She has never used smokeless tobacco. She reports that she does not currently use alcohol. She reports that she does not use drugs.   Physical Exam: BP (!) 156/74   Pulse (!) 101   Ht 5' 5 (1.651 m)   Wt 160 lb (72.6 kg)   BMI 26.63 kg/m   Constitutional:  Alert, No acute distress. HEENT:  [place] AT Respiratory: Normal respiratory effort, no increased work of breathing. Psychiatric: Normal mood and affect.   Assessment & Plan:    1.  Recurrent UTI Doing well on low-dose antibiotic prophylaxis Cephalexin  refilled Continue annual follow-up  2.  Nephrolithiasis 2 mm nonobstructing left renal calculus   Glendia JAYSON Barba, MD  [facility] [address], [address] [place], [place] [address] [phone]

## 2024-12-17 ENCOUNTER — Emergency Department

## 2024-12-17 ENCOUNTER — Encounter: Payer: Self-pay | Admitting: Emergency Medicine

## 2024-12-17 ENCOUNTER — Inpatient Hospital Stay
Admission: EM | Admit: 2024-12-17 | Discharge: 2024-12-19 | DRG: 494 | Disposition: A | Discharge status: Home Health | Attending: Internal Medicine | Admitting: Internal Medicine

## 2024-12-17 ENCOUNTER — Other Ambulatory Visit: Payer: Self-pay

## 2024-12-17 DIAGNOSIS — J439 Emphysema, unspecified: Secondary | ICD-10-CM | POA: Diagnosis not present

## 2024-12-17 DIAGNOSIS — Z880 Allergy status to penicillin: Secondary | ICD-10-CM | POA: Diagnosis not present

## 2024-12-17 DIAGNOSIS — F418 Other specified anxiety disorders: Secondary | ICD-10-CM | POA: Diagnosis present

## 2024-12-17 DIAGNOSIS — J4489 Other specified chronic obstructive pulmonary disease: Secondary | ICD-10-CM | POA: Diagnosis present

## 2024-12-17 DIAGNOSIS — F17210 Nicotine dependence, cigarettes, uncomplicated: Secondary | ICD-10-CM | POA: Diagnosis present

## 2024-12-17 DIAGNOSIS — J440 Chronic obstructive pulmonary disease with acute lower respiratory infection: Secondary | ICD-10-CM

## 2024-12-17 DIAGNOSIS — Z91041 Radiographic dye allergy status: Secondary | ICD-10-CM

## 2024-12-17 DIAGNOSIS — W010XXA Fall on same level from slipping, tripping and stumbling without subsequent striking against object, initial encounter: Secondary | ICD-10-CM | POA: Diagnosis present

## 2024-12-17 DIAGNOSIS — I10 Essential (primary) hypertension: Secondary | ICD-10-CM | POA: Diagnosis not present

## 2024-12-17 DIAGNOSIS — Z888 Allergy status to other drugs, medicaments and biological substances status: Secondary | ICD-10-CM

## 2024-12-17 DIAGNOSIS — Y92009 Unspecified place in unspecified non-institutional (private) residence as the place of occurrence of the external cause: Secondary | ICD-10-CM | POA: Diagnosis not present

## 2024-12-17 DIAGNOSIS — F17200 Nicotine dependence, unspecified, uncomplicated: Secondary | ICD-10-CM | POA: Diagnosis not present

## 2024-12-17 DIAGNOSIS — S82852S Displaced trimalleolar fracture of left lower leg, sequela: Secondary | ICD-10-CM | POA: Diagnosis not present

## 2024-12-17 DIAGNOSIS — J209 Acute bronchitis, unspecified: Secondary | ICD-10-CM | POA: Diagnosis not present

## 2024-12-17 DIAGNOSIS — M62838 Other muscle spasm: Secondary | ICD-10-CM | POA: Diagnosis present

## 2024-12-17 DIAGNOSIS — I129 Hypertensive chronic kidney disease with stage 1 through stage 4 chronic kidney disease, or unspecified chronic kidney disease: Secondary | ICD-10-CM | POA: Diagnosis present

## 2024-12-17 DIAGNOSIS — S82892Q Other fracture of left lower leg, subsequent encounter for open fracture type I or II with malunion: Secondary | ICD-10-CM | POA: Diagnosis not present

## 2024-12-17 DIAGNOSIS — J449 Chronic obstructive pulmonary disease, unspecified: Secondary | ICD-10-CM | POA: Diagnosis present

## 2024-12-17 DIAGNOSIS — F419 Anxiety disorder, unspecified: Secondary | ICD-10-CM | POA: Diagnosis present

## 2024-12-17 DIAGNOSIS — F32A Depression, unspecified: Secondary | ICD-10-CM | POA: Diagnosis present

## 2024-12-17 DIAGNOSIS — S82892B Other fracture of left lower leg, initial encounter for open fracture type I or II: Secondary | ICD-10-CM | POA: Diagnosis not present

## 2024-12-17 DIAGNOSIS — S82842B Displaced bimalleolar fracture of left lower leg, initial encounter for open fracture type I or II: Principal | ICD-10-CM

## 2024-12-17 DIAGNOSIS — M25572 Pain in left ankle and joints of left foot: Secondary | ICD-10-CM | POA: Diagnosis present

## 2024-12-17 DIAGNOSIS — W19XXXA Unspecified fall, initial encounter: Secondary | ICD-10-CM

## 2024-12-17 DIAGNOSIS — S82852B Displaced trimalleolar fracture of left lower leg, initial encounter for open fracture type I or II: Principal | ICD-10-CM | POA: Diagnosis present

## 2024-12-17 DIAGNOSIS — X501XXA Overexertion from prolonged static or awkward postures, initial encounter: Secondary | ICD-10-CM

## 2024-12-17 DIAGNOSIS — N1832 Chronic kidney disease, stage 3b: Secondary | ICD-10-CM | POA: Diagnosis present

## 2024-12-17 DIAGNOSIS — E876 Hypokalemia: Secondary | ICD-10-CM | POA: Diagnosis not present

## 2024-12-17 HISTORY — DX: Chronic kidney disease, stage 3b: N18.32

## 2024-12-17 LAB — CBC WITH DIFFERENTIAL/PLATELET
Abs Immature Granulocytes: 0.04 K/uL (ref 0.00–0.07)
Basophils Absolute: 0.1 K/uL (ref 0.0–0.1)
Basophils Relative: 1 %
Eosinophils Absolute: 0.2 K/uL (ref 0.0–0.5)
Eosinophils Relative: 2 %
HCT: 42.6 % (ref 36.0–46.0)
Hemoglobin: 13.8 g/dL (ref 12.0–15.0)
Immature Granulocytes: 0 %
Lymphocytes Relative: 17 %
Lymphs Abs: 1.7 K/uL (ref 0.7–4.0)
MCH: 30.1 pg (ref 26.0–34.0)
MCHC: 32.4 g/dL (ref 30.0–36.0)
MCV: 92.8 fL (ref 80.0–100.0)
Monocytes Absolute: 0.6 K/uL (ref 0.1–1.0)
Monocytes Relative: 6 %
Neutro Abs: 7.3 K/uL (ref 1.7–7.7)
Neutrophils Relative %: 74 %
Platelets: 258 K/uL (ref 150–400)
RBC: 4.59 MIL/uL (ref 3.87–5.11)
RDW: 12.8 % (ref 11.5–15.5)
WBC: 9.8 K/uL (ref 4.0–10.5)
nRBC: 0 % (ref 0.0–0.2)

## 2024-12-17 LAB — PROTIME-INR
INR: 0.9 (ref 0.8–1.2)
Prothrombin Time: 13.1 s (ref 11.4–15.2)

## 2024-12-17 LAB — APTT: aPTT: 30 s (ref 24–36)

## 2024-12-17 LAB — BASIC METABOLIC PANEL WITH GFR
Anion gap: 13 (ref 5–15)
BUN: 15 mg/dL (ref 8–23)
CO2: 28 mmol/L (ref 22–32)
Calcium: 9.2 mg/dL (ref 8.9–10.3)
Chloride: 100 mmol/L (ref 98–111)
Creatinine, Ser: 0.99 mg/dL (ref 0.44–1.00)
GFR, Estimated: 58 mL/min — ABNORMAL LOW
Glucose, Bld: 128 mg/dL — ABNORMAL HIGH (ref 70–99)
Potassium: 3.7 mmol/L (ref 3.5–5.1)
Sodium: 141 mmol/L (ref 135–145)

## 2024-12-17 MED ORDER — ALBUTEROL SULFATE (2.5 MG/3ML) 0.083% IN NEBU: 2.5000 mg | INHALATION_SOLUTION | RESPIRATORY_TRACT | Status: DC | PRN

## 2024-12-17 MED ORDER — ONDANSETRON HCL 4 MG/2ML IJ SOLN: 4.0000 mg | Freq: Three times a day (TID) | INTRAMUSCULAR | Status: DC | PRN

## 2024-12-17 MED ORDER — METHOCARBAMOL 500 MG PO TABS: 500.0000 mg | ORAL_TABLET | Freq: Three times a day (TID) | ORAL | Status: DC | PRN

## 2024-12-17 MED ORDER — OXYCODONE-ACETAMINOPHEN 5-325 MG PO TABS
1.0000 | ORAL_TABLET | ORAL | Status: DC | PRN
  Administered 2024-12-18 (×2): 1 via ORAL
  Filled 2024-12-17 (×2): qty 1

## 2024-12-17 MED ORDER — HYDRALAZINE HCL 20 MG/ML IJ SOLN: 5.0000 mg | INTRAMUSCULAR | Status: DC | PRN

## 2024-12-17 MED ORDER — SENNOSIDES-DOCUSATE SODIUM 8.6-50 MG PO TABS: 1.0000 | ORAL_TABLET | Freq: Every evening | ORAL | Status: DC | PRN

## 2024-12-17 MED ORDER — OXYCODONE-ACETAMINOPHEN 5-325 MG PO TABS
1.0000 | ORAL_TABLET | ORAL | Status: DC | PRN
  Administered 2024-12-17: 1 via ORAL
  Filled 2024-12-17 (×2): qty 1

## 2024-12-17 MED ORDER — ACETAMINOPHEN 325 MG PO TABS: 650.0000 mg | ORAL_TABLET | Freq: Four times a day (QID) | ORAL | Status: DC | PRN

## 2024-12-17 MED ORDER — NICOTINE 21 MG/24HR TD PT24
21.0000 mg | MEDICATED_PATCH | Freq: Every day | TRANSDERMAL | Status: DC
  Administered 2024-12-19: 21 mg via TRANSDERMAL
  Filled 2024-12-17 (×2): qty 1

## 2024-12-17 MED ORDER — MORPHINE SULFATE (PF) 4 MG/ML IV SOLN
4.0000 mg | Freq: Once | INTRAVENOUS | Status: AC
  Administered 2024-12-17: 4 mg via INTRAVENOUS
  Filled 2024-12-17: qty 1

## 2024-12-17 MED ORDER — MORPHINE SULFATE (PF) 2 MG/ML IV SOLN: 2.0000 mg | INTRAVENOUS | Status: DC | PRN

## 2024-12-17 MED ORDER — CEFAZOLIN SODIUM-DEXTROSE 2-4 GM/100ML-% IV SOLN
2.0000 g | Freq: Three times a day (TID) | INTRAVENOUS | Status: DC
  Administered 2024-12-18 – 2024-12-19 (×4): 2 g via INTRAVENOUS
  Filled 2024-12-17 (×4): qty 100

## 2024-12-17 MED ORDER — DM-GUAIFENESIN ER 30-600 MG PO TB12: 1.0000 | ORAL_TABLET | Freq: Two times a day (BID) | ORAL | Status: DC | PRN

## 2024-12-17 MED ORDER — CEFAZOLIN SODIUM-DEXTROSE 2-4 GM/100ML-% IV SOLN
2.0000 g | Freq: Once | INTRAVENOUS | Status: AC
  Administered 2024-12-17: 2 g via INTRAVENOUS
  Filled 2024-12-17: qty 100

## 2024-12-17 NOTE — ED Notes (Signed)
 Iv started  meds given.  Md at bedside.

## 2024-12-17 NOTE — H&P (Incomplete)
 " History and Physical    Kara Garza FMW:[id number] DOB: 01/02/1946 DOA: 12/17/2024  Referring MD/NP/PA:   PCP: Glover Lenis, MD   Patient coming from:  The patient is coming from home.     Chief Complaint: fall and left ankle pain  HPI: Kara Garza is a 78 y.o. female with medical history significant of HTN, COPD/Asthma, depression with anxiety, CKD stage IIIb, kidney stone, smoker, who presents with fall, left ankle pain.  Pt states that she slipped and fell on some wet stairs, twisting her left ankle this evening.  She immediately developed pain in the left ankle, has been not able to bear weight since then.  The pain is constant, sharp, severe, nonradiating, aggravated by movement. She also noticed a small wound to the medial portion of the ankle from which she has had some bleeding.  No LOC.  Patient strongly denies head or neck injury.  Refused CT scan of head and neck.  Patient does not have chest pain, cough, SOB.  No nausea, vomiting, diarrhea or abdominal pain.  No symptoms of UTI. She initially presented to [facility], was referred to the ED due to concern for an open fracture.   Data reviewed independently and ED Course: pt was found to have WBC 9.8, renal function close to baseline, temperature normal, blood pressure 160/68, heart rate 86, RR 17, oxygen saturation 96% on room air.  Patient is admitted to MedSurg bed as inpatient.  Dr. Malvin of Ortho is consulted.  X-ray of left tibia/fibula: 1. Trimalleolar ankle fracture. 2. No evidence of proximal tibia or fibular fractures.   EKG:  Not done in ED, will get one.     Review of Systems:   General: no fevers, chills, no body weight gain, has fatigue HEENT: no blurry vision, hearing changes or sore throat Respiratory: no dyspnea, coughing, wheezing CV: no chest pain, no palpitations GI: no nausea, vomiting, abdominal pain, diarrhea, constipation GU: no dysuria, burning on urination, increased urinary frequency,  hematuria  Ext: no leg edema Neuro: no unilateral weakness, numbness, or tingling, no vision change or hearing loss. Has fall. Skin: no rash. Has a small wound in left ankle. MSK: has left ankle pain Heme: No easy bruising.  Travel history: No recent Goings distant travel.   Allergy: Allergies[1]  Past Medical History:  Diagnosis Date   Anemia    H/O   Anxiety    Asthma    PT DENIES BUT PCP HAS LISTED ON H&P   Chronic kidney disease    H/O KIDNEY STONES   Chronic renal failure, stage 3b (HCC)    Complication of anesthesia    panic attack during anesthesia for 1st cataract   COPD (chronic obstructive pulmonary disease) (HCC)    PT DENIES BUT PCP HAS LISTED ON H&P   Depression    Hypertension    PONV (postoperative nausea and vomiting)    NAUSEATED   Wears contact lenses    Wears dentures    partial upper, full lower    Past Surgical History:  Procedure Laterality Date   BREAST BIOPSY Right yrs ago   benign   CATARACT EXTRACTION W/PHACO Left 10/30/2023   Procedure: CATARACT EXTRACTION PHACO AND INTRAOCULAR LENS PLACEMENT (IOC) LEFT 7.25 00:43.0;  Surgeon: Myrna Adine Anes, MD;  Location: [facility];  Service: Ophthalmology;  Laterality: Left;   CATARACT EXTRACTION W/PHACO Right 11/13/2023   Procedure: CATARACT EXTRACTION PHACO AND INTRAOCULAR LENS PLACEMENT (IOC) RIGHT  4.82  00:32.4;  Surgeon:  Myrna Adine Anes, MD;  Location: [facility];  Service: Ophthalmology;  Laterality: Right;   CYSTOSCOPY MACROPLASTIQUE IMPLANT N/A 04/12/2016   Procedure: CYSTOSCOPY MACROPLASTIQUE IMPLANT;  Surgeon: Ozell JONELLE Burkes, MD;  Location: [facility] OR[facility];  Service: Urology;  Laterality: N/A;   CYSTOSCOPY/URETEROSCOPY/HOLMIUM LASER/STENT PLACEMENT Right 08/02/2022   Procedure: CYSTOSCOPY/URETEROSCOPY/HOLMIUM LASER/STENT PLACEMENT;  Surgeon: Twylla Glendia BROCKS, MD;  Location: [facility] OR[facility];  Service: Urology;  Laterality: Right;   HAMMER TOE SURGERY     HERNIA REPAIR     TONSILLECTOMY      TUBAL LIGATION      Social History:  reports that she has been smoking cigarettes. She started smoking about 27 years ago. She has a 27 pack-year smoking history. She has never used smokeless tobacco. She reports that she does not currently use alcohol. She reports that she does not use drugs.  Family History:  Family History  Problem Relation Age of Onset   Breast cancer Mother 89     Prior to Admission medications  Medication Sig Start Date End Date Taking? Authorizing Provider  azithromycin  (ZITHROMAX  Z-PAK) 250 MG tablet Take as package directed 11/28/23   Defelice, Rilla, NP  cephALEXin  (KEFLEX ) 250 MG capsule Take 1 capsule (250 mg total) by mouth daily. 09/11/24   Stoioff, Glendia BROCKS, MD  lisinopril-hydrochlorothiazide (PRINZIDE,ZESTORETIC) 20-12.5 MG tablet Take 1 tablet by mouth daily.    [provider]  predniSONE  (DELTASONE ) 50 MG tablet Take 1 tab po daily at breakfast 11/28/23   Defelice, Jeanette, NP  promethazine -dextromethorphan (PROMETHAZINE -DM) 6.25-15 MG/5ML syrup Take 5 mLs by mouth 4 (four) times daily as needed for cough. 11/28/23   Defelice, Jeanette, NP  rOPINIRole (REQUIP) 0.25 MG tablet Take by mouth. Take 1 tablet (0.25 mg total) by mouth at bedtime Start 1 tab nightly. Can increase by 1 tab every 5-7 nights if needed Patient not taking: Reported on 09/11/2024 06/06/23   [provider]  tamsulosin  (FLOMAX ) 0.4 MG CAPS capsule Take 1 capsule (0.4 mg total) by mouth daily. Patient not taking: Reported on 09/11/2024 09/01/23   Twylla Glendia BROCKS, MD  venlafaxine XR (EFFEXOR-XR) 37.5 MG 24 hr capsule Take 150 mg by mouth daily. 02/05/20   [provider]  Vitamin D, Ergocalciferol, 50000 units CAPS Take by mouth daily. 07/27/23   [provider]    Physical Exam: Vitals:   12/17/24 2046 12/17/24 2348  BP: (!) 160/68 (!) 168/60  Pulse: 86 74  Resp: 17 16  Temp: 98 F (36.7 C) 97.9 F (36.6 C)  TempSrc: Oral Oral  SpO2: 96% 98%    General: Not in acute distress HEENT:       Eyes: PERRL, EOMI, no jaundice       ENT: No discharge from the ears and nose, no pharynx injection, no tonsillar enlargement.        Neck: No JVD, no bruit, no mass felt. Heme: No neck lymph node enlargement. Cardiac: S1/S2, RRR, No murmurs, No gallops or rubs. Respiratory: No rales, wheezing, rhonchi or rubs. GI: Soft, nondistended, nontender, no rebound pain, no organomegaly, BS present. GU: No hematuria Ext: No pitting leg edema bilaterally. 1+DP/PT pulse bilaterally.  Has tenderness and mild swelling in left ankle with small wound    Musculoskeletal: No joint deformities, No joint redness or warmth, no limitation of ROM in spin. Skin: No rashes.  Neuro: Alert, oriented X3, cranial nerves II-XII grossly intact, moves all extremities normally. Psych: Patient is not psychotic, no suicidal or hemocidal ideation.  Labs  on Admission: I have personally reviewed following labs and imaging studies  CBC: Recent Labs  Lab 12/17/24 2155  WBC 9.8  NEUTROABS 7.3  HGB 13.8  HCT 42.6  MCV 92.8  PLT 258   Basic Metabolic Panel: Recent Labs  Lab 12/17/24 2155  NA 141  K 3.7  CL 100  CO2 28  GLUCOSE 128*  BUN 15  CREATININE 0.99  CALCIUM 9.2   GFR: CrCl cannot be calculated (Unknown ideal weight.). Liver Function Tests: No results for input(s): AST, ALT, ALKPHOS, BILITOT, PROT, ALBUMIN in the last 168 hours. No results for input(s): LIPASE, AMYLASE in the last 168 hours. No results for input(s): AMMONIA in the last 168 hours. Coagulation Profile: Recent Labs  Lab 12/17/24 2155  INR 0.9   Cardiac Enzymes: No results for input(s): CKTOTAL, CKMB, CKMBINDEX, TROPONINI in the last 168 hours. BNP (last 3 results) No results for input(s): PROBNP in the last 8760 hours. HbA1C: No results for input(s): HGBA1C in the last 72 hours. CBG: No results for input(s): GLUCAP in the last 168  hours. Lipid Profile: No results for input(s): CHOL, HDL, LDLCALC, TRIG, CHOLHDL, LDLDIRECT in the last 72 hours. Thyroid Function Tests: No results for input(s): TSH, T4TOTAL, FREET4, T3FREE, THYROIDAB in the last 72 hours. Anemia Panel: No results for input(s): VITAMINB12, FOLATE, FERRITIN, TIBC, IRON, RETICCTPCT in the last 72 hours. Urine analysis:    Component Value Date/Time   COLORURINE YELLOW 07/03/2023 1412   APPEARANCEUR CLEAR 07/03/2023 1412   APPEARANCEUR Hazy (A) 01/20/2023 0801   LABSPEC 1.025 07/03/2023 1412   PHURINE 5.5 07/03/2023 1412   GLUCOSEU NEGATIVE 07/03/2023 1412   HGBUR NEGATIVE 07/03/2023 1412   BILIRUBINUR NEGATIVE 07/03/2023 1412   BILIRUBINUR Negative 01/20/2023 0801   KETONESUR NEGATIVE 07/03/2023 1412   PROTEINUR NEGATIVE 07/03/2023 1412   NITRITE NEGATIVE 07/03/2023 1412   LEUKOCYTESUR TRACE (A) 07/03/2023 1412   Sepsis Labs: @LABRCNTIP (procalcitonin:4,lacticidven:4) )No results found for this or any previous visit (from the past 240 hours).   Radiological Exams on Admission:   Assessment/Plan Principal Problem:   Open left ankle fracture Active Problems:   Fall at home, initial encounter   HTN (hypertension)   COPD (chronic obstructive pulmonary disease) (HCC)   Chronic kidney disease, stage 3b (HCC)   Depression with anxiety   Smoker   Assessment and Plan:   Open left ankle fracture: X-ray showed trimalleolar ankle fracture. No neurovascular compromise. Orthopedic surgeon, Dr. Malvin was consulted.   - will admit to Med-surg bed as inpt - Pain control: prn morphine , percocet and tyleno - When necessary Zofran  for nausea - Robaxin for muscle spasm - type and cross - INR/PTT - Ancef  2g tid IV  Fall at home, initial encounter -PT/OT when able to (not ordered now) -fall precaution  HTN (hypertension) -IV hydralazine as needed - Continue home Prinzide  COPD (chronic obstructive  pulmonary disease): No SOB, no wheezing. - As needed albuterol  and Mucinex  Chronic kidney disease, stage 3b (HCC): Renal function stable.  Recent baseline creatinine 1.0 on 11/19/2024.  Her creatinine is 0.99, BUN 15, GFR 58. -Follow-up with BMP  Depression with anxiety -Effexor  Smoker - Did counseling about importance of quitting smoking - Nicotine patch  Perioperative Cardiac Risk: pt has multiple comorbidities as listed above. Currently patient is active and independent of ADLs and, IADLs. No hx of CAD or CHF. Has hx of COPD which is stable, no SOB or wheezing on auscultation. At this time point, no further work  up is needed. Patient's GUPTA score perioperative myocardial infarction or cardaic arrest is 2.3%.  I discussed the risk with patient, she would like to proceed for surgery.    DVT ppx: SCD to right leg only  Code Status: Full code    Family Communication:     not done, no family member is at bed side.      Disposition Plan:  rehab  Consults called: Dr. Malvin of Ortho  Admission status and Level of care: Med-Surg:  as inpt        Dispo: The patient is from: Home              Anticipated d/c is to: rehab              Anticipated d/c date is: 2 days              Patient currently is not medically stable to d/c.    Severity of Illness:  The appropriate patient status for this patient is INPATIENT. Inpatient status is judged to be reasonable and necessary in order to provide the required intensity of service to ensure the patient's safety. The patient's presenting symptoms, physical exam findings, and initial radiographic and laboratory data in the context of their chronic comorbidities is felt to place them at high risk for further clinical deterioration. Furthermore, it is not anticipated that the patient will be medically stable for discharge from the hospital within 2 midnights of admission.   * I certify that at the point of admission it is my clinical  judgment that the patient will require inpatient hospital care spanning beyond 2 midnights from the point of admission due to high intensity of service, high risk for further deterioration and high frequency of surveillance required.*       Date of Service 12/18/2024    Caleb [facility]on [facility]   If 7PM-7AM, please contact night-coverage [email or web address] 12/18/2024, 1:26 AM     [1]  Allergies Allergen Reactions   Septra  [Sulfamethoxazole -Trimethoprim ] Other (See Comments)    severe nausea, terrible indigestion/heartburn, diarrhea, vomited a few times, sick to her stomach, breaking out in sweats and weakness/fatigue.   Amoxicillin  Diarrhea   Amoxicillin -Pot Clavulanate Other (See Comments)    Causes GI issues   Ivp Dye [Iodinated Contrast Media] Rash   "

## 2024-12-17 NOTE — ED Provider Notes (Addendum)
 "  [facility] Provider Note    Event Date/Time   First MD Initiated Contact with Patient 12/17/24 2114     (approximate)   History   Chief Complaint Leg Injury   HPI  Kara Garza is a 78 y.o. female with past medical history of hypertension, COPD, and anemia who presents to the ED complaining of leg injury.  Patient reports that about 2 hours prior to arrival she slipped and fell on some wet stairs, twisting her left ankle.  She had immediate onset of pain in the ankle and has been unable to bear weight since then.  She also noticed a small wound to the medial portion of the ankle from which she has had some bleeding.  She initially presented to [facility], was referred to the ED due to concern for an open fracture.  She is unsure of her last tetanus shot.     Physical Exam   Triage Vital Signs: ED Triage Vitals  Encounter Vitals Group     BP 12/17/24 2046 (!) 160/68     Girls Systolic BP Percentile --      Girls Diastolic BP Percentile --      Boys Systolic BP Percentile --      Boys Diastolic BP Percentile --      Pulse Rate 12/17/24 2046 86     Resp 12/17/24 2046 17     Temp 12/17/24 2046 98 F (36.7 C)     Temp Source 12/17/24 2046 Oral     SpO2 12/17/24 2046 96 %     Weight --      Height --      Head Circumference --      Peak Flow --      Pain Score 12/17/24 2050 8     Pain Loc --      Pain Education --      Exclude from Growth Chart --     Most recent vital signs: Vitals:   12/17/24 2046  BP: (!) 160/68  Pulse: 86  Resp: 17  Temp: 98 F (36.7 C)  SpO2: 96%    Constitutional: Alert and oriented. Eyes: Conjunctivae are normal. Head: Atraumatic. Nose: No congestion/rhinnorhea. Mouth/Throat: Mucous membranes are moist.  Cardiovascular: Normal rate, regular rhythm. Grossly normal heart sounds.  2+ radial and DP pulses bilaterally. Respiratory: Normal respiratory effort.  No retractions. Lungs CTAB. Gastrointestinal: Soft and  nontender. No distention. Musculoskeletal: Diffuse tenderness to palpation of left ankle with no obvious deformity.  Small wound to the medial portion of the ankle with minimal active bleeding. Neurologic:  Normal speech and language. No gross focal neurologic deficits are appreciated.    ED Results / Procedures / Treatments   Labs (all labs ordered are listed, but only abnormal results are displayed) Labs Reviewed  BASIC METABOLIC PANEL WITH GFR - Abnormal; Notable for the following components:      Result Value   Glucose, Bld 128 (*)    GFR, Estimated 58 (*)    All other components within normal limits  CBC WITH DIFFERENTIAL/PLATELET     RADIOLOGY Left ankle x-ray reviewed and interpreted by me with fractures of the medial and lateral malleoli, no dislocation noted.  PROCEDURES:  Critical Care performed: No  Procedures   MEDICATIONS ORDERED IN ED: Medications  oxyCODONE -acetaminophen  (PERCOCET/ROXICET) 5-325 MG per tablet 1 tablet (1 tablet Oral Given 12/17/24 2053)  ceFAZolin  (ANCEF ) IVPB 2g/100 mL premix (2 g Intravenous New Bag/Given 12/17/24 2203)  morphine  (  PF) 4 MG/ML injection 4 mg (4 mg Intravenous Given 12/17/24 2200)     IMPRESSION / MDM / ASSESSMENT AND PLAN / ED COURSE  I reviewed the triage vital signs and the nursing notes.                              78 y.o. female with past medical history of hypertension, COPD, and anemia who presents to the ED complaining of fall and injury to her left lower leg.  Patient's presentation is most consistent with acute presentation with potential threat to life or bodily function.  Differential diagnosis includes, but is not limited to, fracture, open fracture, dislocation, neurovascular compromise.  Patient well-appearing and in no acute distress, vital signs are unremarkable.  X-ray shows bimalleolar fracture of the left ankle and she does appear to have an open fracture with small wound over the medial malleolus.   She is neurovascularly intact distally, her tetanus was updated and IV Ancef  was started.  Labs without significant anemia, leukocytosis, electrolyte abnormality, or AKI.  Case discussed with Dr. Malvin of podiatry, who request admission to the hospitalist service and he will plan to operate tomorrow morning.  Wound was copiously irrigated with saline prior to application of sterile dressing and splint.  Case discussed with hospitalist for admission.      FINAL CLINICAL IMPRESSION(S) / ED DIAGNOSES   Final diagnoses:  Type I or II open bimalleolar fracture of left ankle, initial encounter     Rx / DC Orders   ED Discharge Orders     None        Note:  This document was prepared using Dragon voice recognition software and may include unintentional dictation errors.   Willo Dunnings, MD 12/17/24 7743    Willo Dunnings, MD 12/17/24 [phone]  "

## 2024-12-17 NOTE — ED Triage Notes (Addendum)
 Pt arrives from [facility] for left lower leg injury. Pt reports open fx and advised to be seen here. Pt arrives in LLE boot. Pt reports falling tonight down 2 stairs tonight causing injury, denies hitting head, LOC, or blood thinners.

## 2024-12-17 NOTE — ED Notes (Signed)
 Dr willo in with pt again.

## 2024-12-18 ENCOUNTER — Inpatient Hospital Stay

## 2024-12-18 ENCOUNTER — Encounter
Admission: EM | Disposition: A | Discharge status: Home Health | Payer: Self-pay | Source: Home / Self Care | Attending: Internal Medicine

## 2024-12-18 ENCOUNTER — Inpatient Hospital Stay: Admitting: General Practice

## 2024-12-18 ENCOUNTER — Telehealth: Payer: Self-pay | Admitting: Lab

## 2024-12-18 ENCOUNTER — Encounter: Payer: Self-pay | Admitting: Internal Medicine

## 2024-12-18 DIAGNOSIS — S82852S Displaced trimalleolar fracture of left lower leg, sequela: Secondary | ICD-10-CM | POA: Diagnosis not present

## 2024-12-18 DIAGNOSIS — F418 Other specified anxiety disorders: Secondary | ICD-10-CM | POA: Diagnosis not present

## 2024-12-18 DIAGNOSIS — E876 Hypokalemia: Secondary | ICD-10-CM

## 2024-12-18 DIAGNOSIS — S82852B Displaced trimalleolar fracture of left lower leg, initial encounter for open fracture type I or II: Secondary | ICD-10-CM | POA: Diagnosis not present

## 2024-12-18 DIAGNOSIS — J439 Emphysema, unspecified: Secondary | ICD-10-CM

## 2024-12-18 DIAGNOSIS — F17200 Nicotine dependence, unspecified, uncomplicated: Secondary | ICD-10-CM | POA: Diagnosis not present

## 2024-12-18 DIAGNOSIS — N1832 Chronic kidney disease, stage 3b: Secondary | ICD-10-CM | POA: Diagnosis not present

## 2024-12-18 DIAGNOSIS — I10 Essential (primary) hypertension: Secondary | ICD-10-CM | POA: Diagnosis not present

## 2024-12-18 DIAGNOSIS — J449 Chronic obstructive pulmonary disease, unspecified: Secondary | ICD-10-CM | POA: Diagnosis present

## 2024-12-18 HISTORY — PX: ORIF ANKLE FRACTURE: SHX5408

## 2024-12-18 LAB — BASIC METABOLIC PANEL WITH GFR
Anion gap: 10 (ref 5–15)
BUN: 15 mg/dL (ref 8–23)
CO2: 28 mmol/L (ref 22–32)
Calcium: 9.1 mg/dL (ref 8.9–10.3)
Chloride: 101 mmol/L (ref 98–111)
Creatinine, Ser: 1.09 mg/dL — ABNORMAL HIGH (ref 0.44–1.00)
GFR, Estimated: 52 mL/min — ABNORMAL LOW
Glucose, Bld: 113 mg/dL — ABNORMAL HIGH (ref 70–99)
Potassium: 3.2 mmol/L — ABNORMAL LOW (ref 3.5–5.1)
Sodium: 139 mmol/L (ref 135–145)

## 2024-12-18 LAB — TYPE AND SCREEN
ABO/RH(D): O POS
Antibody Screen: NEGATIVE

## 2024-12-18 LAB — CBC
HCT: 38.5 % (ref 36.0–46.0)
Hemoglobin: 12.7 g/dL (ref 12.0–15.0)
MCH: 30.2 pg (ref 26.0–34.0)
MCHC: 33 g/dL (ref 30.0–36.0)
MCV: 91.4 fL (ref 80.0–100.0)
Platelets: 236 K/uL (ref 150–400)
RBC: 4.21 MIL/uL (ref 3.87–5.11)
RDW: 12.8 % (ref 11.5–15.5)
WBC: 7.2 K/uL (ref 4.0–10.5)
nRBC: 0 % (ref 0.0–0.2)

## 2024-12-18 SURGERY — OPEN REDUCTION INTERNAL FIXATION (ORIF) ANKLE FRACTURE
Anesthesia: General | Site: Ankle | Laterality: Left

## 2024-12-18 MED ORDER — 0.9 % SODIUM CHLORIDE (POUR BTL) OPTIME
TOPICAL | Status: DC | PRN
  Administered 2024-12-18: 500 mL

## 2024-12-18 MED ORDER — OXYCODONE HCL 5 MG PO TABS
5.0000 mg | ORAL_TABLET | Freq: Once | ORAL | Status: AC | PRN
  Administered 2024-12-18: 5 mg via ORAL

## 2024-12-18 MED ORDER — BUPIVACAINE LIPOSOME 1.3 % IJ SUSP
INTRAMUSCULAR | Status: AC
  Filled 2024-12-18: qty 20

## 2024-12-18 MED ORDER — BUPIVACAINE HCL (PF) 0.5 % IJ SOLN
INTRAMUSCULAR | Status: AC
  Filled 2024-12-18: qty 10

## 2024-12-18 MED ORDER — ACETAMINOPHEN 10 MG/ML IV SOLN
INTRAVENOUS | Status: AC
  Filled 2024-12-18: qty 100

## 2024-12-18 MED ORDER — BUPIVACAINE LIPOSOME 1.3 % IJ SUSP
INTRAMUSCULAR | Status: DC | PRN
  Administered 2024-12-18 (×2): 10 mL

## 2024-12-18 MED ORDER — MIDAZOLAM HCL 2 MG/2ML IJ SOLN
INTRAMUSCULAR | Status: AC
  Filled 2024-12-18: qty 2

## 2024-12-18 MED ORDER — HYDROMORPHONE HCL 1 MG/ML IJ SOLN
INTRAMUSCULAR | Status: AC
  Filled 2024-12-18: qty 1

## 2024-12-18 MED ORDER — FENTANYL CITRATE (PF) 50 MCG/ML IJ SOSY
PREFILLED_SYRINGE | INTRAMUSCULAR | Status: AC
  Filled 2024-12-18: qty 1

## 2024-12-18 MED ORDER — DEXAMETHASONE SOD PHOSPHATE PF 10 MG/ML IJ SOLN
INTRAMUSCULAR | Status: DC | PRN
  Administered 2024-12-18: 10 mg via INTRAVENOUS

## 2024-12-18 MED ORDER — FENTANYL CITRATE (PF) 100 MCG/2ML IJ SOLN
INTRAMUSCULAR | Status: AC
  Filled 2024-12-18: qty 2

## 2024-12-18 MED ORDER — OXYCODONE HCL 5 MG/5ML PO SOLN: 5.0000 mg | Freq: Once | ORAL | Status: AC | PRN

## 2024-12-18 MED ORDER — ACETAMINOPHEN 10 MG/ML IV SOLN
1000.0000 mg | Freq: Once | INTRAVENOUS | Status: AC | PRN
  Administered 2024-12-18: 1000 mg via INTRAVENOUS

## 2024-12-18 MED ORDER — KETOROLAC TROMETHAMINE 15 MG/ML IJ SOLN
15.0000 mg | Freq: Once | INTRAMUSCULAR | Status: AC
  Administered 2024-12-18: 15 mg via INTRAVENOUS

## 2024-12-18 MED ORDER — POTASSIUM CHLORIDE CRYS ER 20 MEQ PO TBCR
40.0000 meq | EXTENDED_RELEASE_TABLET | Freq: Once | ORAL | Status: AC
  Administered 2024-12-18: 40 meq via ORAL
  Filled 2024-12-18: qty 2

## 2024-12-18 MED ORDER — BUPIVACAINE HCL (PF) 0.25 % IJ SOLN
INTRAMUSCULAR | Status: DC | PRN
  Administered 2024-12-18 (×2): 10 mL via EPIDURAL

## 2024-12-18 MED ORDER — PHENYLEPHRINE 80 MCG/ML (10ML) SYRINGE FOR IV PUSH (FOR BLOOD PRESSURE SUPPORT)
PREFILLED_SYRINGE | INTRAVENOUS | Status: DC | PRN
  Administered 2024-12-18 (×3): 80 ug via INTRAVENOUS

## 2024-12-18 MED ORDER — FENTANYL CITRATE (PF) 100 MCG/2ML IJ SOLN
INTRAMUSCULAR | Status: DC | PRN
  Administered 2024-12-18: 25 ug via INTRAVENOUS

## 2024-12-18 MED ORDER — LACTATED RINGERS IV SOLN: INTRAVENOUS | Status: DC | PRN

## 2024-12-18 MED ORDER — OXYCODONE HCL 5 MG PO TABS
ORAL_TABLET | ORAL | Status: AC
  Filled 2024-12-18: qty 1

## 2024-12-18 MED ORDER — ONDANSETRON HCL 4 MG/2ML IJ SOLN
INTRAMUSCULAR | Status: AC
  Filled 2024-12-18: qty 2

## 2024-12-18 MED ORDER — ADULT MULTIVITAMIN W/MINERALS CH
1.0000 | ORAL_TABLET | Freq: Every day | ORAL | Status: DC
  Administered 2024-12-19: 1 via ORAL
  Filled 2024-12-18: qty 1

## 2024-12-18 MED ORDER — GABAPENTIN 300 MG PO CAPS
900.0000 mg | ORAL_CAPSULE | Freq: Every day | ORAL | Status: DC
  Administered 2024-12-18: 900 mg via ORAL
  Filled 2024-12-18: qty 3

## 2024-12-18 MED ORDER — FENTANYL CITRATE (PF) 50 MCG/ML IJ SOSY
50.0000 ug | PREFILLED_SYRINGE | Freq: Once | INTRAMUSCULAR | Status: AC
  Administered 2024-12-18: 50 ug via INTRAVENOUS

## 2024-12-18 MED ORDER — PROPOFOL 10 MG/ML IV BOLUS
INTRAVENOUS | Status: AC
  Filled 2024-12-18: qty 20

## 2024-12-18 MED ORDER — ENSURE PLUS HIGH PROTEIN PO LIQD
237.0000 mL | Freq: Two times a day (BID) | ORAL | Status: DC
  Administered 2024-12-18 – 2024-12-19 (×2): 237 mL via ORAL

## 2024-12-18 MED ORDER — VENLAFAXINE HCL ER 75 MG PO CP24
75.0000 mg | ORAL_CAPSULE | Freq: Every day | ORAL | Status: DC
  Administered 2024-12-19: 75 mg via ORAL
  Filled 2024-12-18 (×2): qty 1

## 2024-12-18 MED ORDER — MIDAZOLAM HCL (PF) 2 MG/2ML IJ SOLN
1.0000 mg | INTRAMUSCULAR | Status: AC | PRN
  Administered 2024-12-18 (×2): 1 mg via INTRAVENOUS

## 2024-12-18 MED ORDER — HYDROMORPHONE HCL 1 MG/ML IJ SOLN: 0.5000 mg | INTRAMUSCULAR | Status: DC | PRN

## 2024-12-18 MED ORDER — LIDOCAINE HCL (PF) 2 % IJ SOLN
INTRAMUSCULAR | Status: AC
  Filled 2024-12-18: qty 5

## 2024-12-18 MED ORDER — HYDROCHLOROTHIAZIDE 12.5 MG PO TABS
12.5000 mg | ORAL_TABLET | Freq: Every day | ORAL | Status: DC
  Administered 2024-12-19: 12.5 mg via ORAL
  Filled 2024-12-18: qty 1

## 2024-12-18 MED ORDER — ONDANSETRON HCL 4 MG/2ML IJ SOLN
INTRAMUSCULAR | Status: DC | PRN
  Administered 2024-12-18: 4 mg via INTRAVENOUS

## 2024-12-18 MED ORDER — LISINOPRIL-HYDROCHLOROTHIAZIDE 20-12.5 MG PO TABS: 1.0000 | ORAL_TABLET | Freq: Every day | ORAL | Status: DC

## 2024-12-18 MED ORDER — KETOROLAC TROMETHAMINE 15 MG/ML IJ SOLN
INTRAMUSCULAR | Status: AC
  Filled 2024-12-18: qty 1

## 2024-12-18 MED ORDER — HYDROMORPHONE HCL 1 MG/ML IJ SOLN
0.5000 mg | INTRAMUSCULAR | Status: DC | PRN
  Administered 2024-12-18 (×2): 0.5 mg via INTRAVENOUS

## 2024-12-18 MED ORDER — LISINOPRIL 20 MG PO TABS
20.0000 mg | ORAL_TABLET | Freq: Every day | ORAL | Status: DC
  Administered 2024-12-19: 20 mg via ORAL
  Filled 2024-12-18: qty 1

## 2024-12-18 MED ORDER — ROCURONIUM BROMIDE 10 MG/ML (PF) SYRINGE
PREFILLED_SYRINGE | INTRAVENOUS | Status: AC
  Filled 2024-12-18: qty 10

## 2024-12-18 MED ORDER — PROPOFOL 10 MG/ML IV BOLUS
INTRAVENOUS | Status: DC | PRN
  Administered 2024-12-18: 150 mg via INTRAVENOUS

## 2024-12-18 MED ORDER — FENTANYL CITRATE (PF) 100 MCG/2ML IJ SOLN
25.0000 ug | INTRAMUSCULAR | Status: DC | PRN
  Administered 2024-12-18 (×2): 25 ug via INTRAVENOUS
  Administered 2024-12-18 (×2): 50 ug via INTRAVENOUS

## 2024-12-18 SURGICAL SUPPLY — 63 items
3.5x 10 screw (Screw) IMPLANT
BIT DRILL 2 CANN GRADUATED (BIT) IMPLANT
BIT DRILL 2.5 CANN LNG (BIT) IMPLANT
BIT DRILL 2.5 CANN STRL (BIT) IMPLANT
BIT DRILL 2.6 CANN (BIT) IMPLANT
BIT DRILL 3 CANN ENDOSCOPIC (BIT) IMPLANT
BLADE SURG SZ10 CARB STEEL (BLADE) ×1 IMPLANT
BNDG COMPR 6X5.8 VLCR NS LF (GAUZE/BANDAGES/DRESSINGS) ×1 IMPLANT
BNDG ELASTIC 4INX 5YD STR LF (GAUZE/BANDAGES/DRESSINGS) ×1 IMPLANT
BNDG ELASTIC 4X5.8 VLCR NS LF (GAUZE/BANDAGES/DRESSINGS) ×1 IMPLANT
BNDG ELASTIC 6INX 5YD STR LF (GAUZE/BANDAGES/DRESSINGS) IMPLANT
BNDG ESMARCH 4X12 STRL LF (GAUZE/BANDAGES/DRESSINGS) ×1 IMPLANT
BNDG GAUZE DERMACEA FLUFF 4 (GAUZE/BANDAGES/DRESSINGS) IMPLANT
CHLORAPREP W/TINT 26 (MISCELLANEOUS) ×1 IMPLANT
CUFF TRNQT CYL 24X4X16.5-23 (TOURNIQUET CUFF) IMPLANT
CUFF TRNQT CYL 30X4X21-28X (TOURNIQUET CUFF) IMPLANT
DRAPE C-ARM 42X70 (DRAPES) ×1 IMPLANT
DRAPE C-ARMOR (DRAPES) ×1 IMPLANT
DRAPE FLUOR MINI C-ARM 54X84 (DRAPES) IMPLANT
ELECTRODE REM PT RTRN 9FT ADLT (ELECTROSURGICAL) ×1 IMPLANT
GAUZE SPONGE 4X4 12PLY STRL (GAUZE/BANDAGES/DRESSINGS) IMPLANT
GAUZE XEROFORM 1X8 LF (GAUZE/BANDAGES/DRESSINGS) ×1 IMPLANT
GLOVE BIOGEL PI IND STRL 7.5 (GLOVE) ×1 IMPLANT
GLOVE SURG SYN 7.5 PF PI (GLOVE) ×1 IMPLANT
GOWN STRL REUS W/ TWL LRG LVL3 (GOWN DISPOSABLE) ×1 IMPLANT
GUIDEWIRE 1.35MM (WIRE) IMPLANT
KIT TURNOVER KIT A (KITS) ×1 IMPLANT
KWIRE BB-TAK (WIRE) IMPLANT
LABEL OR SOLS (LABEL) ×1 IMPLANT
MANIFOLD NEPTUNE II (INSTRUMENTS) ×1 IMPLANT
NEEDLE HYPO 22X1.5 SAFETY MO (MISCELLANEOUS) ×1 IMPLANT
NS IRRIG 500ML POUR BTL (IV SOLUTION) ×1 IMPLANT
PACK EXTREMITY ARMC (MISCELLANEOUS) ×1 IMPLANT
PAD ABD DERMACEA PRESS 5X9 (GAUZE/BANDAGES/DRESSINGS) IMPLANT
PAD PREP OB/GYN DISP 24X41 (PERSONAL CARE ITEMS) ×1 IMPLANT
PADDING CAST BLEND 4X4 NS (MISCELLANEOUS) ×2 IMPLANT
PADDING CAST BLEND 4X4 STRL (MISCELLANEOUS) IMPLANT
PENCIL SMOKE EVACUATOR (MISCELLANEOUS) ×1 IMPLANT
PLATE LOCK DP TI LT 4H (Plate) IMPLANT
SCREW COMP KREULOCK 3.5X10 (Screw) IMPLANT
SCREW KREULOCK 3.0X10 (Screw) IMPLANT
SCREW LOCK COMPR LP 3.5X12 (Screw) IMPLANT
SCREW LP 3.5X12MM (Screw) IMPLANT
SCREW LP CORT 3.0X20 (Screw) IMPLANT
SCREW QCFIX CANN 4X50 SHT (Screw) IMPLANT
SCREW VAL KREULOCK 3.0X12 TI (Screw) IMPLANT
SOLN STERILE WATER 500 ML (IV SOLUTION) ×1 IMPLANT
SPLINT CAST 1 STEP 4X30 (MISCELLANEOUS) ×2 IMPLANT
SPONGE T-LAP 18X18 ~~LOC~~+RFID (SPONGE) ×1 IMPLANT
STAPLER SKIN PROX 35W (STAPLE) IMPLANT
STOCKINETTE 48X4 2 PLY STRL (GAUZE/BANDAGES/DRESSINGS) ×1 IMPLANT
STOCKINETTE M/LG 89821 (MISCELLANEOUS) ×1 IMPLANT
STOCKINETTE ORTHO 4X25 (MISCELLANEOUS) IMPLANT
STOCKINETTE ORTHO 6X25 (MISCELLANEOUS) IMPLANT
STRAP SAFETY 5IN WIDE (MISCELLANEOUS) ×1 IMPLANT
SUCTION TUBE FRAZIER 10FR DISP (SUCTIONS) IMPLANT
SUT MNCRL AB 3-0 PS2 27 (SUTURE) ×1 IMPLANT
SUT PROLENE 3 0 PS 2 (SUTURE) IMPLANT
SUT PROLENE 4 0 PS 2 18 (SUTURE) ×1 IMPLANT
SWAB CULTURE AMIES ANAERIB BLU (MISCELLANEOUS) IMPLANT
SYR 10ML LL (SYRINGE) ×1 IMPLANT
TIP FAN IRRIG PULSAVAC PLUS (DISPOSABLE) IMPLANT
TRAP FLUID SMOKE EVACUATOR (MISCELLANEOUS) ×1 IMPLANT

## 2024-12-18 NOTE — Assessment & Plan Note (Signed)
 Nicotine  patch.

## 2024-12-18 NOTE — Hospital Course (Signed)
 77 y.o. female with medical history significant of HTN, COPD/Asthma, depression with anxiety, CKD stage IIIb, kidney stone, smoker, who presents with fall, left ankle pain.   Pt states that she slipped and fell on some wet stairs, twisting her left ankle this evening.  She immediately developed pain in the left ankle, has been not able to bear weight since then.  The pain is constant, sharp, severe, nonradiating, aggravated by movement. She also noticed a small wound to the medial portion of the ankle from which she has had some bleeding.  No LOC.  Patient strongly denies head or neck injury.  Refused CT scan of head and neck.  Patient does not have chest pain, cough, SOB.  No nausea, vomiting, diarrhea or abdominal pain.  No symptoms of UTI. She initially presented to [facility], was referred to the ED due to concern for an open fracture.    Data reviewed independently and ED Course: pt was found to have WBC 9.8, renal function close to baseline, temperature normal, blood pressure 160/68, heart rate 86, RR 17, oxygen saturation 96% on room air.  Patient is admitted to MedSurg bed as inpatient.  Dr. Malvin of Ortho is consulted.  12/31.  Patient seen postoperatively.  Did have a lot of pain in the recovery room. 12/19/2024.  Patient feeling much better.  She states she has tramadol at home.  She states she has Keflex  at home.  Does not want any medications into her pharmacy.

## 2024-12-18 NOTE — Transfer of Care (Signed)
 Immediate Anesthesia Transfer of Care Note  Patient: Vernesha Talbot Kubitz  Procedure(s) Performed: OPEN REDUCTION INTERNAL FIXATION (ORIF) ANKLE FRACTURE LEFT (Left: Ankle)  Patient Location: PACU  Anesthesia Type:General  Level of Consciousness: drowsy  Airway & Oxygen Therapy: Patient Spontanous Breathing and Patient connected to nasal cannula oxygen  Post-op Assessment: Report given to RN and Post -op Vital signs reviewed and stable  Post vital signs: Reviewed and stable  Last Vitals:  Vitals Value Taken Time  BP 159/67 12/18/24 10:12  Temp 37 C 12/18/24 10:12  Pulse 85 12/18/24 10:15  Resp 16 12/18/24 10:15  SpO2 100 % 12/18/24 10:15  Vitals shown include unfiled device data.  Last Pain:  Vitals:   12/18/24 0747  TempSrc:   PainSc: 0-No pain      Patients Stated Pain Goal: 0 (12/18/24 0041)  Complications: No notable events documented.

## 2024-12-18 NOTE — Assessment & Plan Note (Addendum)
 Patient brought to the operating room on 12/31 for left ankle irrigation debridement and open reduction internal fixation of trimalleolar fracture.  PT and OT recommended home health.  PT recommended 3 in 1 commode and a wheelchair and not the knee scooter.  Patient will be nonweightbearing left lower leg.  Podiatry recommended aspirin 81 mg p.o. twice daily while nonweightbearing.

## 2024-12-18 NOTE — Anesthesia Preprocedure Evaluation (Signed)
 "                                  Anesthesia Evaluation  Patient identified by MRN, date of birth, ID band Patient awake    Reviewed: Allergy & Precautions, H&P , NPO status , Patient's Chart, lab work & pertinent test results  History of Anesthesia Complications (+) PONV and history of anesthetic complications  Airway Mallampati: IV  TM Distance: >3 FB Neck ROM: Full    Dental no notable dental hx. (+) Missing   Pulmonary neg pulmonary ROS, asthma , COPD, Current Smoker and Patient abstained from smoking.   Pulmonary exam normal breath sounds clear to auscultation       Cardiovascular hypertension, On Medications negative cardio ROS Normal cardiovascular exam Rhythm:Regular Rate:Normal   ECG 07/29/22:  Sinus tachycardia Right atrial enlargement Borderline ECG When compared with ECG of 06-Feb-2020 15:12, No significant change was found    Neuro/Psych  PSYCHIATRIC DISORDERS Anxiety Depression    negative neurological ROS  negative psych ROS   GI/Hepatic negative GI ROS, Neg liver ROS,,,  Endo/Other  negative endocrine ROS    Renal/GU      Musculoskeletal   Abdominal   Peds  Hematology negative hematology ROS (+) Blood dyscrasia, anemia   Anesthesia Other Findings Past Medical History: No date: Anemia     Comment:  H/O No date: Anxiety No date: Asthma     Comment:  PT DENIES BUT PCP HAS LISTED ON H&P No date: Chronic kidney disease     Comment:  H/O KIDNEY STONES No date: Chronic renal failure, stage 3b (HCC) No date: Complication of anesthesia     Comment:  panic attack during anesthesia for 1st cataract No date: COPD (chronic obstructive pulmonary disease) (HCC)     Comment:  PT DENIES BUT PCP HAS LISTED ON H&P No date: Depression No date: Hypertension No date: PONV (postoperative nausea and vomiting)     Comment:  NAUSEATED No date: Wears contact lenses No date: Wears dentures     Comment:  partial upper, full lower  Past  Surgical History: yrs ago: BREAST BIOPSY; Right     Comment:  benign 10/30/2023: CATARACT EXTRACTION W/PHACO; Left     Comment:  Procedure: CATARACT EXTRACTION PHACO AND INTRAOCULAR               LENS PLACEMENT (IOC) LEFT 7.25 00:43.0;  Surgeon: Myrna Adine Anes, MD;  Location: [facility];                Service: Ophthalmology;  Laterality: Left; 11/13/2023: CATARACT EXTRACTION W/PHACO; Right     Comment:  Procedure: CATARACT EXTRACTION PHACO AND INTRAOCULAR               LENS PLACEMENT (IOC) RIGHT  4.82  00:32.4;  Surgeon:               Myrna Adine Anes, MD;  Location: [facility];                Service: Ophthalmology;  Laterality: Right; 04/12/2016: CYSTOSCOPY MACROPLASTIQUE IMPLANT; N/A     Comment:  Procedure: CYSTOSCOPY MACROPLASTIQUE IMPLANT;  Surgeon:               Ozell JONELLE Burkes, MD;  Location: [facility];  Service:  Urology;  Laterality: N/A; 08/02/2022: CYSTOSCOPY/URETEROSCOPY/HOLMIUM LASER/STENT PLACEMENT;  Right     Comment:  Procedure: CYSTOSCOPY/URETEROSCOPY/HOLMIUM LASER/STENT               PLACEMENT;  Surgeon: Twylla Glendia BROCKS, MD;  Location:               [facility];  Service: Urology;  Laterality: Right; No date: HAMMER TOE SURGERY No date: HERNIA REPAIR No date: TONSILLECTOMY No date: TUBAL LIGATION     Reproductive/Obstetrics negative OB ROS                              Anesthesia Physical Anesthesia Plan  ASA: 3  Anesthesia Plan: General   Post-op Pain Management: Regional block*   Induction: Intravenous  PONV Risk Score and Plan: 3 and Ondansetron , Dexamethasone  and Midazolam   Airway Management Planned: LMA  Additional Equipment:   Intra-op Plan:   Post-operative Plan: Extubation in OR  Informed Consent: I have reviewed the patients History and Physical, chart, labs and discussed the procedure including the risks, benefits and alternatives for the proposed anesthesia with  the patient or authorized representative who has indicated his/her understanding and acceptance.     Dental Advisory Given  Plan Discussed with: Anesthesiologist, CRNA and Surgeon  Anesthesia Plan Comments: (Patient consented for risks of anesthesia including but not limited to:  - adverse reactions to medications - damage to eyes, teeth, lips or other oral mucosa - nerve damage due to positioning  - sore throat or hoarseness - Damage to heart, brain, nerves, lungs, other parts of body or loss of life  Patient voiced understanding and assent.)        Anesthesia Quick Evaluation  "

## 2024-12-18 NOTE — Assessment & Plan Note (Signed)
 Creatinine 1.12 with a GFR 50

## 2024-12-18 NOTE — Anesthesia Procedure Notes (Signed)
 Anesthesia Regional Block: Popliteal block   Pre-Anesthetic Checklist: , timeout performed,  Correct Patient, Correct Site, Correct Laterality,  Correct Procedure, Correct Position, site marked,  Risks and benefits discussed,  Surgical consent,  Pre-op evaluation,  At surgeon's request and post-op pain management  Laterality: Lower and Left  Prep: chloraprep       Needles:  Injection technique: Single-shot  Needle Type: Echogenic Needle     Needle Length: 9cm  Needle Gauge: 21     Additional Needles:   Procedures:,,,, ultrasound used (permanent image in chart),,    Narrative:  Start time: 12/18/2024 8:15 AM End time: 12/18/2024 8:17 AM Injection made incrementally with aspirations every 5 mL.  Performed by: Personally  Anesthesiologist: Leavy Ned, MD  Additional Notes: Patient's chart reviewed and they were deemed appropriate candidate for procedure, at surgeon's request. Patient educated about risks, benefits, and alternatives of the block including but not limited to: temporary or permanent nerve damage, bleeding, infection, damage to surround tissues, block failure, local anesthetic toxicity. Patient expressed understanding. A formal time-out was conducted consistent with institution rules.  Monitors were applied, and minimal sedation used. The site was prepped with skin prep and allowed to dry, and sterile gloves were used. A high frequency linear ultrasound probe with probe cover was utilized throughout. Popliteal artery pulsatile and visualized in popliteal fossa along with adjacent sciatic nerve and its branch point, which appeared anatomically normal, local anesthetic injected around them just proximal to the branch point, and echogenic block needle trajectory was monitored throughout. Aspiration performed every 5ml. Blood vessels were avoided. All injections were performed without resistance and free of blood and paresthesias. The patient tolerated the procedure well.  A picture of the nerve block was added to the patient's chart.  Injectate: 10ml of exparel and 10 ml of 0.25% bupivacaine

## 2024-12-18 NOTE — Progress Notes (Signed)
 " Progress Note   Patient: Kara Garza FMW:[id number] DOB: 10-25-1946 DOA: 12/17/2024     1 DOS: the patient was seen and examined on 12/18/2024   Brief hospital course: 78 y.o. female with medical history significant of HTN, COPD/Asthma, depression with anxiety, CKD stage IIIb, kidney stone, smoker, who presents with fall, left ankle pain.   Pt states that she slipped and fell on some wet stairs, twisting her left ankle this evening.  She immediately developed pain in the left ankle, has been not able to bear weight since then.  The pain is constant, sharp, severe, nonradiating, aggravated by movement. She also noticed a small wound to the medial portion of the ankle from which she has had some bleeding.  No LOC.  Patient strongly denies head or neck injury.  Refused CT scan of head and neck.  Patient does not have chest pain, cough, SOB.  No nausea, vomiting, diarrhea or abdominal pain.  No symptoms of UTI. She initially presented to [facility], was referred to the ED due to concern for an open fracture.    Data reviewed independently and ED Course: pt was found to have WBC 9.8, renal function close to baseline, temperature normal, blood pressure 160/68, heart rate 86, RR 17, oxygen saturation 96% on room air.  Patient is admitted to MedSurg bed as inpatient.  Dr. Malvin of Ortho is consulted.  12/31.  Patient seen postoperatively.  Did have a lot of pain in the recovery room.  Assessment and Plan: * Open left ankle fracture Patient brought to the operating room on 12/31 for left ankle irrigation debridement and open reduction internal fixation of trimalleolar fracture.  Family requesting a knee scooter.  PT and OT consultations.  Patient will be nonweightbearing left lower leg.  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  Came in after left ankle fracture.  Brought to the operating room today.  Physical Exam: Vitals:   12/18/24 1130 12/18/24 1140 12/18/24 1158 12/18/24 1210  BP: (!) 129/54 (!) 110/56 (!) 105/95   Pulse: 81 80 84   Resp: 11 14 17    Temp:  98 F (36.7 C) (!) 97.5 F (36.4 C)   TempSrc:      SpO2: 96% 97% 96%   Height:    5' 5 (1.651 m)   Physical Exam HENT:     Head: Normocephalic.  Eyes:     General: Lids are normal.  Cardiovascular:     Rate and Rhythm: Normal rate and regular rhythm.     Heart sounds: Normal heart sounds, S1 normal and S2 normal.  Pulmonary:     Breath sounds: No decreased breath sounds, wheezing, rhonchi or rales.  Abdominal:     Palpations: Abdomen is soft.     Tenderness: There is no abdominal tenderness.  Musculoskeletal:     Right lower leg: No swelling.     Left lower leg: No swelling.  Skin:    General: Skin is warm.     Findings: No rash.  Neurological:     Mental Status: She is alert.     Comments: Able to wiggle toes.  Left ankle in cam boot.  Data Reviewed: Potassium 3.2, creatinine 1.09, CBC normal range  Family Communication: Family at bedside  Disposition: Status is: Inpatient Remains inpatient appropriate because: Patient went to the operating room today  Planned Discharge Destination: Home with Home Health    Time spent: 28 minutes  Author: Charlie Patterson, MD 12/18/2024 2:35 PM  For on call review [email or web address].  "

## 2024-12-18 NOTE — Op Note (Signed)
 Full Operative Report  Date of Operation: 10:12 AM, 12/18/2024   Patient: Kara Garza - 78 y.o. female  Surgeon: [facility], DPM   Assistant: None  Diagnosis: Open trimalleolar ankle fracture left ankle  Procedure:  1. Left ankle irrigation and debridement and open reduction internal fixation trimalleolar fracture     Anesthesia: Anesthesia type not filed in the log.  Vicci Camellia Glatter, MD  Anesthesiologist: Vicci Camellia Glatter, MD; Leavy Ned, MD CRNA: Jaylene Nest, CRNA; [place], [place], CRNA   Estimated Blood Loss: Minimal   Hemostasis: 1) Anatomical dissection, mechanical compression, electrocautery 2) Thigh tourniquet inflated to 300 mm Hg 71 min  Implants: Implant Name Type Inv. Item Serial No. Manufacturer Lot No. LRB No. Used Action  SCREW LP CORT 3.0X20 - [id number] Screw SCREW LP CORT 3.0X20  [employer]  Left 1 Implanted  PLATE LOCK DP TI LT 4H - [id number] Plate PLATE LOCK DP TI LT 4H  [employer]  Left 1 Implanted  SCREW KREULOCK 3.0X10 - [id number] Screw SCREW KREULOCK 3.0X10  [employer]  Left 1 Implanted  SCREW VAL KREULOCK 3.0X12 TI - [id number] Screw SCREW VAL KREULOCK 3.0X12 TI  [employer]  Left 3 Implanted  SCREW LP 3.5X12MM - [id number] Screw SCREW LP 3.5X12MM  [employer]  Left 1 Implanted  SCREW LOCK COMPR LP 3.5X12 - [id number] Screw SCREW LOCK COMPR LP 3.5X12  [employer]  Left 1 Implanted  3.5x 10 screw Screw   [employer]  Left 1 Implanted  SCREW QCFIX CANN 4X50 SHT - [id number] Screw SCREW QCFIX CANN 4X50 SHT  [employer]  Left 2 Implanted    Materials: monocryl, prolene, skin staples  Injectables: 1) Pre-operatively: Pre op block per anesthesia 2) Post-operatively: None   Specimens: - Pathology: None - Microbiology: Swab cultue from the open fracture puncture wound   Antibiotics: IV ancef  2 gm give pre op  Drains: None  Complications: Patient tolerated the procedure well without complication.   Operative  findings: As below in detailed report  Indications for Procedure: Kara Garza presents to [facility] [place] with a chief complaint of gustillo 1 open trimalleolar ankle fracture left ankle. The patient has failed conservative treatments of various modalities. At this time the patient has elected to proceed with surgical correction. All alternatives, risks, and complications of the procedures were thoroughly explained to the patient. Patient exhibits appropriate understanding of all discussion points and informed consent was signed and obtained in the chart with no guarantees to surgical outcome given or implied.  Description of Procedure: Patient was brought to the operating room. Patient transferred to the OR table and was well padded in supine position. A surgical timeout was performed and all members of the operating room, the procedure, and the surgical site were identified. anesthesia occurred as per anesthesia record. Local anesthetic as previously described was then injected about the operative field in a local infiltrative block.  The operative lower extremity as noted above was then prepped and draped in the usual sterile manner. The following procedure then began.  At start of case light irrigation with bulb syringe was carried out of the medial malleolar wound and a swab culture was taken. The puncture wound was very small <0.5 cm in length and no contamination of the wound was seen.  Attention was directed to the LEFT lateral ankle. A longitudinal incision was made midline over the distal fibula approximately 8 cm in length.  subcutaneous tissues, deep fascia, and periosteum using  sharp and blunt dissection. Care was taken to retract all vital neurovascular structures. The periosteum was reflected off the fibula utilizing a key elevator. At this time the obliquely oriented fracture site was easily identified. Curettage was used to debride all hematoma and callus formation. Traction  was applied and reduction was achieved with bone reduction forceps. Fluoroscopy demonstrated excellent alignment of the fracture site with restoration of the length, angulation, and rotation. Next, an interfragmentary 3.0 solid screw was placed under standard AO technique. Excellent compression of the fracture site was observed.   The anatomic distal fibular locking plate previously described was placed over fibula and was well contoured. A combination of locking screws distal to the fracture site and combination cortical and locking screws proximally were used to secure the plate. Hardware placement and reduction was confirmed with fluoroscopy. The area was then flushed with copious amounts of sterile saline. Then using the suture materials previously described, the site was closed in anatomic layers and the skin was well approximated under minimal tension.  Attention was then directed to the LEFT medial ankle. A small longitudinal incision was made over the distal medial tibia communicating with the small 0.5 cm puncture wound which skin margin was debrided. The incision was deepened through subcutaneous tissues, deep fascia, and periosteum using sharp and blunt dissection. Care was taken to retract all vital neurovascular structures. A pulse lavage was then used to irrigate the area with 1 L of sterile saline. At this time the transverse fracture site was easily identified. Curettage was used to debride all hematoma and callus formation. The fracture was realigned and reduced with bone reduction forceps. Excellent alignment and compression of the fracture site was noted both clinically and radiographically.  Attention was specifically noted to the ankle joint where no intra-articular stepoff was noted.   Next, two parallel K-wires were driven from distal to proximal and at an oblique angle. The partially threaded cannulated screws previously described were used to secure the fracture site. Excellent  compression of the fracture site was observed.   Hardware placement and reduction was confirmed with fluoroscopy. The area was then flushed with copious amounts of sterile saline. Then using the suture materials previously described, the site was closed in anatomic layers and the skin was well approximated under minimal tension.  Attention was directed to the ankle joint. An intraoperative external rotation stress test was performed under fluoroscopyto evaluate for syndesmotic injury. No Increased widening of the syndesmosis or the medial clear space was observed. It was determined that fixation of syndesmosis would be not be necessary. Fl uroscopy was also used to evaluate the posterior malleolar fracture which was noted to be in good alignment with no step off detected in the tibiotalar joint post lateral/medial reduction. Therefore decision was made that fixation would not be required of the posterior malleolar fracture.   The surgical site was then dressed with xerform, 4x4 abd pad, kerlix, cast padding ace. The patient tolerated both the procedure and anesthesia well with vital signs stable throughout. The patient was transferred in good condition and all vital signs stable  from the OR to recovery under the discretion of anesthesia.  Condition: Vital signs stable, neurovascular status unchanged from preoperative   Surgical plan:  NWB in CAM boot. Recommend 24 hrs IV abx from surgery and then 5 days keflex  on dc. Ok for [employer] home tmrw if clears PT to be NWB. Will leave boot and dressing c/d/I until follow up next week Tuesday/Wednesday.   The  patient will be NWB in a CAM boot to the operative limb until further instructed. The dressing is to remain clean, dry, and intact. Will continue to follow unless noted elsewhere.   Marsa Honour, DPM [facility]

## 2024-12-18 NOTE — Progress Notes (Addendum)
 Initial Nutrition Assessment  DOCUMENTATION CODES:   Not applicable  INTERVENTION:   -Obtain new weight -Continue regular diet -Ensure Plus High Protein po BID, each supplement provides 350 kcal and 20 grams of protein  -MVI with minerals daily  NUTRITION DIAGNOSIS:   Increased nutrient needs related to post-op healing as evidenced by estimated needs.  GOAL:   Patient will meet greater than or equal to 90% of their needs  MONITOR:   PO intake, Supplement acceptance  REASON FOR ASSESSMENT:   Consult Assessment of nutrition requirement/status, Hip fracture protocol  ASSESSMENT:   78 y.o. female with medical history significant of HTN, COPD/Asthma, depression with anxiety, CKD stage IIIb, kidney stone, smoker, who presents with fall, left ankle pain.  Patient admitted with open left ankle fracture.   12/31- s/p Left ankle irrigation and debridement and open reduction internal fixation trimalleolar fracture   Reviewed I/O's: +648 ml x 24 hours  Per H&P, patient sustained injury after twisting her ankle after falling on wet stairs. She also noticed a small wound to the medial portion of the ankle from which she has had some bleeding. X-ray revealed trimalleolar ankle fracture.   Patient unavailable at time of visit. Patient down in OR and no family present in room.  RD unable to obtain further nutrition-related history or complete nutrition-focused physical exam at this time.    Reviewed weight history. Last weight obtained on 09/11/24. Weight trends do not suggest weight loss prior to this (64-73 kg over the past 2 years). RD will order new weight to better assess weight trends.   Patient with increased nutritional needs for post-op healing and would benefit from addition of oral nutrition supplements.   Per orthopedics notes, patient is hopeful to rturn home at discharge.   Medications reviewed and include neurontin.   Labs reviewed: K: 3.2.    Diet Order:   Diet  Order             Diet regular Room service appropriate? Yes; Fluid consistency: Thin  Diet effective now                   EDUCATION NEEDS:   No education needs have been identified at this time  Skin:  Skin Assessment: Skin Integrity Issues: Skin Integrity Issues:: Incisions Incisions: closed left leg  Last BM:  12/18/24  Height:   Ht Readings from Last 1 Encounters:  09/11/24 5' 5 (1.651 m)    Weight:   Wt Readings from Last 1 Encounters:  09/11/24 72.6 kg    Ideal Body Weight:  56.8 kg  BMI:  There is no height or weight on file to calculate BMI.  Estimated Nutritional Needs:   Kcal:  1700-1900  Protein:  90-105 grams  Fluid:  1.7-1.9 L    Margery ORN, RD, LDN, CDCES Registered Dietitian III Certified Diabetes Care and Education Specialist If unable to reach this RD, please use RD Inpatient group chat on secure chat between hours of 8am-4 pm daily

## 2024-12-18 NOTE — Anesthesia Procedure Notes (Signed)
 Anesthesia Regional Block: Adductor canal block   Pre-Anesthetic Checklist: , timeout performed,  Correct Patient, Correct Site, Correct Laterality,  Correct Procedure, Correct Position, site marked,  Risks and benefits discussed,  Surgical consent,  Pre-op evaluation,  At surgeon's request and post-op pain management  Laterality: Lower  Prep: chloraprep       Needles:  Injection technique: Single-shot  Needle Type: Echogenic Needle     Needle Length: 9cm  Needle Gauge: 21     Additional Needles:   Procedures:,,,, ultrasound used (permanent image in chart),,    Narrative:  Start time: 12/18/2024 8:12 AM End time: 12/18/2024 8:14 AM Injection made incrementally with aspirations every 5 mL.  Performed by: Personally  Anesthesiologist: Leavy Ned, MD  Additional Notes: Patient's chart reviewed and they were deemed appropriate candidate for procedure, per surgeon's request. Patient educated about risks, benefits, and alternatives of the block including but not limited to: temporary or permanent nerve damage, bleeding, infection, damage to surround tissues, block failure, local anesthetic toxicity. Patient expressed understanding. A formal time-out was conducted consistent with institution rules.  Monitors were applied, and minimal sedation used (see nursing record). The site was prepped with skin prep and allowed to dry, and sterile gloves were used. A high frequency linear ultrasound probe with probe cover was utilized throughout. Femoral artery visualized at mid-thigh level, local anesthetic injected anterolateral to it, and echogenic block needle trajectory was monitored throughout. Hydrodissection of saphenous nerve visualized and appeared anatomically normal. Aspiration performed every 5ml. Blood vessels were avoided. All injections were performed without resistance and free of blood and paresthesias. The patient tolerated the procedure well. A picture of the nerve block was  added to the patient's chart.  Injectate: 10ml of exparel and 10ml of 0.25% of bupivacaine

## 2024-12-18 NOTE — Anesthesia Postprocedure Evaluation (Signed)
"   Anesthesia Post Note  Patient: Kara Garza  Procedure(s) Performed: OPEN REDUCTION INTERNAL FIXATION (ORIF) ANKLE FRACTURE LEFT (Left: Ankle)  Patient location during evaluation: PACU Anesthesia Type: General Level of consciousness: awake and alert Pain management: pain level controlled Vital Signs Assessment: post-procedure vital signs reviewed and stable Respiratory status: spontaneous breathing, nonlabored ventilation and respiratory function stable Cardiovascular status: blood pressure returned to baseline and stable Postop Assessment: no apparent nausea or vomiting Anesthetic complications: no   No notable events documented.   Last Vitals:  Vitals:   12/18/24 1140 12/18/24 1158  BP: (!) 110/56 (!) 105/95  Pulse: 80 84  Resp: 14 17  Temp: 36.7 C (!) 36.4 C  SpO2: 97% 96%    Last Pain:  Vitals:   12/18/24 1208  TempSrc:   PainSc: 4                  [name]      "

## 2024-12-18 NOTE — Consult Note (Addendum)
 "  PODIATRY CONSULTATION  NAME Kara Garza MRN [id number] DOB [birth year] DOA 12/17/2024   Reason for consult:  Chief Complaint  Patient presents with   Leg Injury    Attending/Consulting physician: ED  History of present illness: Kara Garza is a 78 y.o. female with medical history significant of HTN, COPD/Asthma, depression with anxiety, CKD stage IIIb, kidney stone, smoker, who presents with fall, left ankle pain.   Pt states that she slipped and fell on some wet stairs, twisting her left ankle this evening.  She immediately developed pain in the left ankle, has been not able to bear weight since then.  The pain is constant, sharp, severe, nonradiating, aggravated by movement. She also noticed a small wound to the medial portion of the ankle from which she has had some bleeding.  No LOC.  Patient strongly denies head or neck injury.  Refused CT scan of head and neck.  Patient does not have chest pain, cough, SOB.  No nausea, vomiting, diarrhea or abdominal pain.  No symptoms of UTI. She initially presented to [facility], was referred to the ED due to concern for an open fracture.   Saw patient this Am and discussed plan for ORIF of ankle with washout of the small puncture wound, she agreed to proceed. We discussed after care. She lives with husband but states he can't help her much. She is hopeful to be able to DC home.   Past Medical History:  Diagnosis Date   Anemia    H/O   Anxiety    Asthma    PT DENIES BUT PCP HAS LISTED ON H&P   Chronic kidney disease    H/O KIDNEY STONES   Chronic renal failure, stage 3b (HCC)    Complication of anesthesia    panic attack during anesthesia for 1st cataract   COPD (chronic obstructive pulmonary disease) (HCC)    PT DENIES BUT PCP HAS LISTED ON H&P   Depression    Hypertension    PONV (postoperative nausea and vomiting)    NAUSEATED   Wears contact lenses    Wears dentures    partial upper, full lower       Latest Ref Rng &  Units 12/18/2024    6:00 AM 12/17/2024    9:55 PM 07/29/2022    9:34 AM  CBC  WBC 4.0 - 10.5 K/uL 7.2  9.8  6.5   Hemoglobin 12.0 - 15.0 g/dL 87.2  86.1  85.8   Hematocrit 36.0 - 46.0 % 38.5  42.6  43.2   Platelets 150 - 400 K/uL 236  258  221        Latest Ref Rng & Units 12/18/2024    6:00 AM 12/17/2024    9:55 PM 07/29/2022    9:34 AM  BMP  Glucose 70 - 99 mg/dL 886  871  861   BUN 8 - 23 mg/dL 15  15  16    Creatinine 0.44 - 1.00 mg/dL 8.90  9.00  8.74   Sodium 135 - 145 mmol/L 139  141  136   Potassium 3.5 - 5.1 mmol/L 3.2  3.7  3.6   Chloride 98 - 111 mmol/L 101  100  101   CO2 22 - 32 mmol/L 28  28  23    Calcium 8.9 - 10.3 mg/dL 9.1  9.2  9.2       Physical Exam: Lower Extremity Exam  Left ankle splinted  Sensation intact to all toes  CFT <  3 sec, toes warm to touch  + flexion/extension of digits  From ED- small < 1 cm lac medial ankle concerning for open fracture puncture no exposed bone however   Narrative & Impression  CLINICAL DATA:  Pain after injury.  Left lower leg fracture.   EXAM: LEFT TIBIA AND FIBULA - 2 VIEW   COMPARISON:  None Available.   FINDINGS: Oblique mildly displaced distal fibular fracture above the level of the ankle mortise. Transverse fracture of the medial malleolus at the level of the ankle mortise. Vertically oriented fracture through the posterior tibial tubercle. No evidence of proximal tibia or fibular fractures. Small ankle joint effusion. Generalized soft tissue edema distally. Knee alignment is maintained.    ASSESSMENT/PLAN OF CARE 78 y.o. female with PMHx significant for HTN, COPD/Asthma, depression with anxiety, CKD stage IIIb, kidney stone, smoker with  Left ankle trimalleolar ankle fracture and small <1 cm puncture wound medial malleolus ( gustillo 1)    - NPO for OR this AM for Left ankle I&D, ORIF. Discussed risks benefits alternatives, she agrees to proceed.  - Got ancef  in ed, will need re dosed prior to  surgery - Anticoagulation: hold pending OR, ok to resume later tonight - Wound care: None pre op - WB status: NWB to LLE in CAM boot post op - Will continue to follow   Thank you for the consult.  Please contact me directly with any questions or concerns.           Marolyn JULIANNA Honour, DPM [facility] / [facility]    [address] [place], [place] [address]                Office [phone]  Fax [phone]     "

## 2024-12-18 NOTE — Assessment & Plan Note (Signed)
 Replace potassium

## 2024-12-18 NOTE — Anesthesia Procedure Notes (Addendum)
 Procedure Name: LMA Insertion Date/Time: 12/18/2024 8:34 AM  Performed by: Ansel Ferrall, CRNAPre-anesthesia Checklist: Patient identified, Patient being monitored, Timeout performed, Emergency Drugs available and Suction available Patient Re-evaluated:Patient Re-evaluated prior to induction Oxygen Delivery Method: Circle system utilized Preoxygenation: Pre-oxygenation with 100% oxygen Induction Type: IV induction Ventilation: Mask ventilation without difficulty LMA: LMA inserted LMA Size: 4.0 Tube type: Oral Number of attempts: 1 Placement Confirmation: positive ETCO2 and breath sounds checked- equal and bilateral Tube secured with: Tape Dental Injury: Teeth and Oropharynx as per pre-operative assessment

## 2024-12-18 NOTE — Assessment & Plan Note (Signed)
Respiratory status stable. 

## 2024-12-18 NOTE — Assessment & Plan Note (Signed)
On Effexor 

## 2024-12-18 NOTE — Telephone Encounter (Signed)
 Patient is scheduled 12/26/2023 [place].

## 2024-12-18 NOTE — Assessment & Plan Note (Signed)
 Patient on lisinopril and HCTZ

## 2024-12-19 DIAGNOSIS — F418 Other specified anxiety disorders: Secondary | ICD-10-CM | POA: Diagnosis not present

## 2024-12-19 DIAGNOSIS — N1832 Chronic kidney disease, stage 3b: Secondary | ICD-10-CM | POA: Diagnosis not present

## 2024-12-19 DIAGNOSIS — I10 Essential (primary) hypertension: Secondary | ICD-10-CM | POA: Diagnosis not present

## 2024-12-19 DIAGNOSIS — S82892Q Other fracture of left lower leg, subsequent encounter for open fracture type I or II with malunion: Secondary | ICD-10-CM

## 2024-12-19 DIAGNOSIS — E876 Hypokalemia: Secondary | ICD-10-CM | POA: Diagnosis not present

## 2024-12-19 DIAGNOSIS — J439 Emphysema, unspecified: Secondary | ICD-10-CM | POA: Diagnosis not present

## 2024-12-19 DIAGNOSIS — F17200 Nicotine dependence, unspecified, uncomplicated: Secondary | ICD-10-CM | POA: Diagnosis not present

## 2024-12-19 LAB — BASIC METABOLIC PANEL WITH GFR
Anion gap: 10 (ref 5–15)
BUN: 23 mg/dL (ref 8–23)
CO2: 26 mmol/L (ref 22–32)
Calcium: 9.1 mg/dL (ref 8.9–10.3)
Chloride: 103 mmol/L (ref 98–111)
Creatinine, Ser: 1.12 mg/dL — ABNORMAL HIGH (ref 0.44–1.00)
GFR, Estimated: 50 mL/min — ABNORMAL LOW
Glucose, Bld: 133 mg/dL — ABNORMAL HIGH (ref 70–99)
Potassium: 4.2 mmol/L (ref 3.5–5.1)
Sodium: 138 mmol/L (ref 135–145)

## 2024-12-19 LAB — MAGNESIUM: Magnesium: 2 mg/dL (ref 1.7–2.4)

## 2024-12-19 LAB — HEMOGLOBIN: Hemoglobin: 12.1 g/dL (ref 12.0–15.0)

## 2024-12-19 MED ORDER — ASPIRIN 81 MG PO TBEC: 81.0000 mg | DELAYED_RELEASE_TABLET | Freq: Two times a day (BID) | ORAL | Status: AC

## 2024-12-19 MED ORDER — TRAMADOL HCL 50 MG PO TABS: 50.0000 mg | ORAL_TABLET | Freq: Four times a day (QID) | ORAL | Status: AC | PRN

## 2024-12-19 MED ORDER — CEPHALEXIN 250 MG PO CAPS: 500.0000 mg | ORAL_CAPSULE | Freq: Three times a day (TID) | ORAL | Status: AC

## 2024-12-19 MED ORDER — GABAPENTIN 300 MG PO CAPS: 900.0000 mg | ORAL_CAPSULE | Freq: Every day | ORAL | Status: AC

## 2024-12-19 MED ORDER — GABAPENTIN 300 MG PO CAPS: 900.0000 mg | ORAL_CAPSULE | Freq: Every day | ORAL | Status: DC

## 2024-12-19 MED ORDER — ASPIRIN 81 MG PO TBEC: 81.0000 mg | DELAYED_RELEASE_TABLET | Freq: Two times a day (BID) | ORAL | Status: DC

## 2024-12-19 MED ORDER — NICOTINE 21 MG/24HR TD PT24: 21.0000 mg | MEDICATED_PATCH | Freq: Every day | TRANSDERMAL | Status: AC

## 2024-12-19 NOTE — Discharge Summary (Signed)
 " Physician Discharge Summary   Patient: Kara Garza MRN: [id number] DOB: 1946-06-27  Admit date:     12/17/2024  Discharge date: 12/19/2024  Discharge Physician: Charlie Patterson   PCP: Glover Lenis, MD   Recommendations at discharge:   Follow-up PCP 5 days Follow-up Dr. Malvin in 1 week  Discharge Diagnoses: Principal Problem:   Open left ankle fracture Active Problems:   Fall at home, initial encounter   HTN (hypertension)   COPD (chronic obstructive pulmonary disease) (HCC)   Chronic kidney disease, stage 3b (HCC)   Depression with anxiety   Smoker   Type I or II open trimalleolar fracture of left ankle   Hypokalemia    Hospital Course: 79 y.o. female with medical history significant of HTN, COPD/Asthma, depression with anxiety, CKD stage IIIb, kidney stone, smoker, who presents with fall, left ankle pain.   Pt states that she slipped and fell on some wet stairs, twisting her left ankle this evening.  She immediately developed pain in the left ankle, has been not able to bear weight since then.  The pain is constant, sharp, severe, nonradiating, aggravated by movement. She also noticed a small wound to the medial portion of the ankle from which she has had some bleeding.  No LOC.  Patient strongly denies head or neck injury.  Refused CT scan of head and neck.  Patient does not have chest pain, cough, SOB.  No nausea, vomiting, diarrhea or abdominal pain.  No symptoms of UTI. She initially presented to [facility], was referred to the ED due to concern for an open fracture.    Data reviewed independently and ED Course: pt was found to have WBC 9.8, renal function close to baseline, temperature normal, blood pressure 160/68, heart rate 86, RR 17, oxygen saturation 96% on room air.  Patient is admitted to MedSurg bed as inpatient.  Dr. Malvin of Ortho is consulted.  12/31.  Patient seen postoperatively.  Did have a lot of pain in the recovery room. 12/19/2024.  Patient  feeling much better.  She states she has tramadol at home.  She states she has Keflex  at home.  Does not want any medications into her pharmacy.  Assessment and Plan: * Open left ankle fracture Patient brought to the operating room on 12/31 for left ankle irrigation debridement and open reduction internal fixation of trimalleolar fracture.  PT and OT recommended home health.  PT recommended 3 in 1 commode and a wheelchair and not the knee scooter.  Patient will be nonweightbearing left lower leg.  Podiatry recommended aspirin 81 mg p.o. twice daily while nonweightbearing.  COPD (chronic obstructive pulmonary disease) (HCC) Respiratory status stable  HTN (hypertension) Patient on lisinopril and HCTZ  Chronic kidney disease, stage 3b (HCC) Creatinine 1.12 with a GFR 50  Depression with anxiety On Effexor  Smoker Nicotine patch  Hypokalemia Replaced.  I mention to her if potassium becomes low as outpatient can consider changing the hydrochlorothiazide to something else.         Consultants: Podiatry Procedures performed: Left ankle irrigation debridement and open reduction internal fixation of trimalleolar fracture. Disposition: Home health Diet recommendation:  Cardiac diet DISCHARGE MEDICATION: Allergies as of 12/19/2024       Reactions   Septra  [sulfamethoxazole -trimethoprim ] Other (See Comments)   severe nausea, terrible indigestion/heartburn, diarrhea, vomited a few times, sick to her stomach, breaking out in sweats and weakness/fatigue.   Amoxicillin  Diarrhea   Amoxicillin -pot Clavulanate Other (See Comments)   Causes GI issues  Ivp Dye [iodinated Contrast Media] Rash        Medication List     STOP taking these medications    azithromycin  250 MG tablet Commonly known as: Zithromax  Z-Pak   predniSONE  50 MG tablet Commonly known as: DELTASONE    promethazine -dextromethorphan 6.25-15 MG/5ML syrup Commonly known as: PROMETHAZINE -DM   rOPINIRole 0.25 MG  tablet Commonly known as: REQUIP   tamsulosin  0.4 MG Caps capsule Commonly known as: FLOMAX    Vitamin D (Ergocalciferol) 50000 units Caps       TAKE these medications    aspirin EC 81 MG tablet Take 1 tablet (81 mg total) by mouth 2 (two) times daily. Swallow whole.   cephALEXin  250 MG capsule Commonly known as: KEFLEX  Take 2 capsules (500 mg total) by mouth 3 (three) times daily for 5 days. Then can go back on your normal dose after What changed:  how much to take when to take this additional instructions   gabapentin 300 MG capsule Commonly known as: NEURONTIN Take 3 capsules (900 mg total) by mouth at bedtime.   lisinopril-hydrochlorothiazide 20-12.5 MG tablet Commonly known as: ZESTORETIC Take 1 tablet by mouth daily.   nicotine 21 mg/24hr patch Commonly known as: NICODERM CQ - dosed in mg/24 hours Place 1 patch (21 mg total) onto the skin daily. Start taking on: December 20, 2024   traMADol 50 MG tablet Commonly known as: Ultram Take 1 tablet (50 mg total) by mouth every 6 (six) hours as needed.   venlafaxine XR 37.5 MG 24 hr capsule Commonly known as: EFFEXOR-XR Take 150 mg by mouth daily.               Durable Medical Equipment  (From admission, onward)           Start     Ordered   12/19/24 1026  For home use only DME lightweight manual wheelchair with seat cushion  Once       Comments: Patient suffers from ankle fracture which impairs their ability to perform daily activities like toileting in the home.  A walker will not resolve  issue with performing activities of daily living. A wheelchair will allow patient to safely perform daily activities. Patient is not able to propel themselves in the home using a standard weight wheelchair due to general weakness. Patient can self propel in the lightweight wheelchair. Length of need 6 months . Accessories: elevating leg rests (ELRs), wheel locks, extensions and anti-tippers.   12/19/24 1025   12/19/24  1025  For home use only DME 3 n 1  Once        12/19/24 1024            Contact information for follow-up providers     Glover Lenis, MD Follow up in 5 day(s).   Specialty: Family Medicine Contact information: [address] [place] [address] [phone]         Malvin Marsa FALCON, DPM Follow up in 1 week(s).   Specialty: Actuary information: [address] [place] [address] [phone]              Contact information for after-discharge care     Home Medical Care     [facility]) .   Service: Home Health Services Contact information: [address] [place]  [phone]  Discharge Exam: Filed Weights   12/19/24 0346  Weight: 75.5 kg   Physical Exam HENT:     Head: Normocephalic.  Eyes:     General: Lids are normal.  Cardiovascular:     Rate and Rhythm: Normal rate and regular rhythm.     Heart sounds: Normal heart sounds, S1 normal and S2 normal.  Pulmonary:     Breath sounds: No decreased breath sounds, wheezing, rhonchi or rales.  Abdominal:     Palpations: Abdomen is soft.     Tenderness: There is no abdominal tenderness.  Musculoskeletal:     Right lower leg: No swelling.     Left lower leg: No swelling.  Skin:    General: Skin is warm.     Findings: No rash.  Neurological:     Mental Status: She is alert.     Comments: Able to wiggle toes.  Left ankle in cam boot.      Condition at discharge: stable  The results of significant diagnostics from this hospitalization (including imaging, microbiology, ancillary and laboratory) are listed below for reference.   Imaging Studies: DG Ankle Complete Left Result Date: 12/18/2024 CLINICAL DATA:  Postop. EXAM: LEFT ANKLE COMPLETE - 3+ VIEW COMPARISON:  Radiograph yesterday FINDINGS: Lateral plate and screw fixation of distal fibular fracture.  Two screws traverse the medial malleolar fracture. The posterior tibial tubercle fracture on prior is not seen on the current exam. Ankle mortise is preserved. Recent postsurgical change includes air and edema in the soft tissues with lateral skin staples in place. IMPRESSION: ORIF of distal fibular and medial malleolar fractures. Electronically Signed   By: Andrea Gasman M.D.   On: 12/18/2024 12:37   DG MINI C-ARM IMAGE ONLY Result Date: 12/18/2024 There is no interpretation for this exam.  This order is for images obtained during a surgical procedure.  Please See Surgeries Tab for more information regarding the procedure.   US  OR NERVE BLOCK-IMAGE ONLY [facility]) Result Date: 12/18/2024 There is no interpretation for this exam.  This order is for images obtained during a surgical procedure.  Please See Surgeries Tab for more information regarding the procedure.   DG Tibia/Fibula Left Result Date: 12/17/2024 CLINICAL DATA:  Pain after injury.  Left lower leg fracture. EXAM: LEFT TIBIA AND FIBULA - 2 VIEW COMPARISON:  None Available. FINDINGS: Oblique mildly displaced distal fibular fracture above the level of the ankle mortise. Transverse fracture of the medial malleolus at the level of the ankle mortise. Vertically oriented fracture through the posterior tibial tubercle. No evidence of proximal tibia or fibular fractures. Small ankle joint effusion. Generalized soft tissue edema distally. Knee alignment is maintained. IMPRESSION: 1. Trimalleolar ankle fracture. 2. No evidence of proximal tibia or fibular fractures. Electronically Signed   By: Andrea Gasman M.D.   On: 12/17/2024 23:01    Microbiology: Results for orders placed or performed during the hospital encounter of 12/17/24  Aerobic/Anaerobic Culture w Gram Stain (surgical/deep wound)     Status: None (Preliminary result)   Collection Time: 12/18/24  8:50 AM   Specimen: Wound  Result Value Ref Range Status   Specimen Description    Final    WOUND Performed at [facility], [address] [place], [place] [address]    Special Requests   Final    NONE Performed at [facility], [address] [place], [place] [address]    Gram Stain NO WBC SEEN NO ORGANISMS SEEN   Final   Culture  Final    NO GROWTH < 24 HOURS Performed at [facility], [address], [place], [place] [address]    Report Status PENDING  Incomplete    Labs: CBC: Recent Labs  Lab 12/17/24 2155 12/18/24 0600 12/19/24 0612  WBC 9.8 7.2  --   NEUTROABS 7.3  --   --   HGB 13.8 12.7 12.1  HCT 42.6 38.5  --   MCV 92.8 91.4  --   PLT 258 236  --    Basic Metabolic Panel: Recent Labs  Lab 12/17/24 2155 12/18/24 0600 12/19/24 0612  NA 141 139 138  K 3.7 3.2* 4.2  CL 100 101 103  CO2 28 28 26   GLUCOSE 128* 113* 133*  BUN 15 15 23   CREATININE 0.99 1.09* 1.12*  CALCIUM 9.2 9.1 9.1  MG  --   --  2.0   Liver Function Tests: No results for input(s): AST, ALT, ALKPHOS, BILITOT, PROT, ALBUMIN in the last 168 hours. CBG: No results for input(s): GLUCAP in the last 168 hours.  Discharge time spent: greater than 30 minutes.  Signed: Charlie Patterson, MD [facility] 12/19/2024 "

## 2024-12-19 NOTE — Evaluation (Signed)
 Physical Therapy Evaluation Patient Details Name: Kara Garza MRN: [id number] DOB: July 17, 1946 Today's Date: 12/19/2024  History of Present Illness  : Kara Garza is a 79 y.o. female with medical history significant of HTN, COPD/Asthma, depression with anxiety, CKD stage IIIb, kidney stone, smoker, who presents with fall, left ankle pain. S/P Left ankle irrigation and debridement and open reduction internal fixation trimalleolar fracture   Clinical Impression  Patient received in bed, she is pleasant and agrees to PT/OT assessment. 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 Patient is mod independent with bed mobility. Cues needed for safe transfers ( hand placement, weight bearing restrictions) and cga/min A. Patient is able to hop to bathroom and back to bed with RW and cga. Cues for safety needed. She is unsteady initially but improved with practice. Patient will continue to benefit from skilled PT to improve functional independence and safety with mobility.           If plan is discharge home, recommend the following: A little help with walking and/or transfers;A little help with bathing/dressing/bathroom;Assist for transportation;Help with stairs or ramp for entrance;Assistance with cooking/housework   Can travel by private vehicle    yes    Equipment Recommendations Wheelchair (measurements PT);BSC/3in1  Recommendations for Other Services       Functional Status Assessment Patient has had a recent decline in their functional status and demonstrates the ability to make significant improvements in function in a reasonable and predictable amount of time.     Precautions / Restrictions Precautions Precautions: Fall Recall of Precautions/Restrictions: Impaired Restrictions Weight Bearing Restrictions Per Provider Order: Yes LLE Weight Bearing Per Provider Order: Non weight bearing      Mobility  Bed Mobility Overal bed mobility: Modified  Independent                  Transfers Overall transfer level: Needs assistance Equipment used: Rolling walker (2 wheels) Transfers: Sit to/from Stand Sit to Stand: Contact guard assist           General transfer comment: Cues for safe hand placement    Ambulation/Gait Ambulation/Gait assistance: Contact guard assist Gait Distance (Feet): 25 Feet Assistive device: Rolling walker (2 wheels) Gait Pattern/deviations: Step-to pattern, Trunk flexed Gait velocity: decr     General Gait Details: cues needed for safe use of AD, educated on height adjustment as she will be using a walker they have at home.  Stairs            Wheelchair Mobility     Tilt Bed    Modified Rankin (Stroke Patients Only)       Balance Overall balance assessment: Needs assistance Sitting-balance support: Feet supported Sitting balance-Leahy Scale: Normal     Standing balance support: Bilateral upper extremity supported, During functional activity, Reliant on assistive device for balance Standing balance-Leahy Scale: Fair                               Pertinent Vitals/Pain Pain Assessment Pain Assessment: No/denies pain    Home Living Family/patient expects to be discharged to:: Private residence Living Arrangements: Spouse/significant other Available Help at Discharge: Family;Available PRN/intermittently Type of Home: House Home Access: Ramped entrance       Home Layout: One level Home Equipment: Rolling Walker (2 wheels) Additional Comments: children in several times a week but pt is caregiver for husband    Prior  Function Prior Level of Function : Independent/Modified Independent;Driving                     Extremity/Trunk Assessment   Upper Extremity Assessment Upper Extremity Assessment: Defer to OT evaluation    Lower Extremity Assessment Lower Extremity Assessment: LLE deficits/detail LLE Deficits / Details: NWB LLE Coordination:  decreased gross motor    Cervical / Trunk Assessment Cervical / Trunk Assessment: Normal  Communication   Communication Communication: No apparent difficulties    Cognition Arousal: Alert Behavior During Therapy: WFL for tasks assessed/performed   PT - Cognitive impairments: No apparent impairments                         Following commands: Intact       Cueing Cueing Techniques: Verbal cues     General Comments      Exercises     Assessment/Plan    PT Assessment Patient needs continued PT services  PT Problem List Decreased strength;Decreased activity tolerance;Decreased balance;Decreased mobility;Decreased coordination;Decreased knowledge of use of DME;Decreased safety awareness       PT Treatment Interventions DME instruction;Gait training;Functional mobility training;Therapeutic activities;Therapeutic exercise;Balance training;Patient/family education;Wheelchair mobility training    PT Goals (Current goals can be found in the Care Plan section)  Acute Rehab PT Goals Patient Stated Goal: return home PT Goal Formulation: With patient Time For Goal Achievement: 01/02/25 Potential to Achieve Goals: Good    Frequency Min 4X/week     Co-evaluation PT/OT/SLP Co-Evaluation/Treatment: Yes Reason for Co-Treatment: For patient/therapist safety;To address functional/ADL transfers PT goals addressed during session: Mobility/safety with mobility;Balance;Proper use of DME         AM-PAC PT 6 Clicks Mobility  Outcome Measure Help needed turning from your back to your side while in a flat bed without using bedrails?: None Help needed moving from lying on your back to sitting on the side of a flat bed without using bedrails?: None Help needed moving to and from a bed to a chair (including a wheelchair)?: A Little Help needed standing up from a chair using your arms (e.g., wheelchair or bedside chair)?: A Little Help needed to walk in hospital room?: A  Little Help needed climbing 3-5 steps with a railing? : Total 6 Click Score: 18    End of Session   Activity Tolerance: Patient limited by fatigue Patient left: in bed;with call bell/phone within reach;with bed alarm set Nurse Communication: Mobility status PT Visit Diagnosis: Other abnormalities of gait and mobility (R26.89);Difficulty in walking, not elsewhere classified (R26.2);Unsteadiness on feet (R26.81);History of falling (Z91.81)    Time: 8994-8975 PT Time Calculation (min) (ACUTE ONLY): 19 min   Charges:   PT Evaluation $PT Eval Moderate Complexity: 1 Mod   PT General Charges $$ ACUTE PT VISIT: 1 Visit         Zoeie Ritter, PT, GCS 12/19/2024,10:36 AM

## 2024-12-19 NOTE — TOC CM/SW Note (Signed)
 Patient is not able to walk the distance required to go the bathroom, or he/she is unable to safely negotiate stairs required to access the bathroom.  A 3in1 BSC will alleviate this problem

## 2024-12-19 NOTE — TOC Initial Note (Signed)
 Transition of Care [facility]) - Initial/Assessment Note    Patient Details  Name: Kara Garza MRN: [id number] Date of Birth: May 01, 1946  Transition of [facility]) CM/SW Contact:    Daved JONETTA Hamilton, RN Phone Number: 12/19/2024, 11:00 AM  Clinical Narrative:                  Spoke with patient, introduced self and explained role. Patient verbalized prior to this hospital encounter she is independent in ADL's, including driving, and taking care of her spouse. Patient verbalized a great support system at home. Patient verbalized that her sister is coming to take her home today and her daughter will come by later today. Order has been placed thru [employer], this CM received confirmation [employer] received the orders, unknown ETA for room delivery as it's a holiday and orders are filled as they are received.   Presented choice for [facility] agency, patient verbalized [facility]. [facility] selected in [place] and [place]  notified.  TOC signing off.  Expected Discharge Plan: Home w Home Health Services Barriers to Discharge: Barriers Resolved   Patient Goals and CMS Choice   CMS Medicare.gov Compare Post Acute Care list provided to:: Patient Choice offered to / list presented to : Patient      Expected Discharge Plan and Services   Discharge Planning Services: CM Consult Post Acute Care Choice: Home Health, Durable Medical Equipment Living arrangements for the past 2 months: Single Family Home Expected Discharge Date: 12/19/24               DME Arranged: Community education officer wheelchair with seat cushion, Bedside commode DME Agency: [employer] Date DME Agency Contacted: 12/19/24 Time DME Agency Contacted: 1059 Representative spoke with at DME Agency: Group E-mail per [employer] workflow HH Arranged: PT HH Agency: [facility] Date [facility] Agency Contacted: 12/19/24 Time HH Agency Contacted: 1100 Representative spoke with at [facility] Agency: [place]   Prior Living Arrangements/Services Living arrangements  for the past 2 months: Single Family Home Lives with:: Spouse Patient language and need for interpreter reviewed:: Yes Do you feel safe going back to the place where you live?: Yes      Need for Family Participation in Patient Care: Yes (Comment) Care giver support system in place?: Yes (comment)   Criminal Activity/Legal Involvement Pertinent to Current Situation/Hospitalization: No - Comment as needed  Activities of Daily Living   ADL Screening (condition at time of admission) Independently performs ADLs?: Yes (appropriate for developmental age) Is the patient deaf or have difficulty hearing?: No Does the patient have difficulty seeing, even when wearing glasses/contacts?: No Does the patient have difficulty concentrating, remembering, or making decisions?: No  Permission Sought/Granted                  Emotional Assessment   Attitude/Demeanor/Rapport: Engaged, Self-Confident Affect (typically observed): Appropriate Orientation: : Oriented to Self, Oriented to Place, Oriented to  Time, Oriented to Situation, Fluctuating Orientation (Suspected and/or reported Sundowners) Alcohol / Substance Use: Not Applicable Psych Involvement: No (comment)  Admission diagnosis:  Open left ankle fracture [S82.892B] Type I or II open bimalleolar fracture of left ankle, initial encounter [S82.842B] Patient Active Problem List   Diagnosis Date Noted   COPD (chronic obstructive pulmonary disease) (HCC) 12/18/2024   Type I or II open trimalleolar fracture of left ankle 12/18/2024   Hypokalemia 12/18/2024   Open left ankle fracture 12/17/2024   Fall at home, initial encounter 12/17/2024   HTN (hypertension) 12/17/2024   Depression with anxiety  12/17/2024   Chronic kidney disease, stage 3b (HCC) 12/17/2024   COPD (chronic obstructive pulmonary disease) with acute bronchitis (HCC) 11/28/2023   Acute cough 11/28/2023   Smoker 04/27/2020   Anxiety 12/02/2015   PCP:  Glover Lenis,  MD Pharmacy:   [facility] - [place], [place] - [address] [place] [address] Phone: [phone] Fax: [phone]     Social Drivers of Health (SDOH) Social History: SDOH Screenings   Food Insecurity: No Food Insecurity (12/18/2024)  Housing: Low Risk (12/18/2024)  Transportation Needs: No Transportation Needs (12/18/2024)  Utilities: Not At Risk (12/18/2024)  Financial Resource Strain: Low Risk  (11/19/2024)   Received from [facility]  Social Connections: Socially Integrated (12/18/2024)  Tobacco Use: High Risk (12/18/2024)   SDOH Interventions:     Readmission Risk Interventions     No data to display

## 2024-12-19 NOTE — Progress Notes (Addendum)
 D/C AVS completed and reviewed with pt. All opportunities for questions answered and clarified. IV removed. Pt will be wheeled down to car at medical mall entrance via wheelchair.  D/c barrier: DME wheelchair delivery   1545 Wheelchair delivered. Per Dr Malvin pt needs to take 81mg  PO aspirin BID for 30days. Pt and sister verbalized understanding

## 2024-12-19 NOTE — Evaluation (Signed)
 Occupational Therapy Evaluation Patient Details Name: Kara Garza MRN: [id number] DOB: [birth year] Today's Date: 12/19/2024   History of Present Illness   : Kara Garza is a 79 y.o. female with medical history significant of HTN, COPD/Asthma, depression with anxiety, CKD stage IIIb, kidney stone, smoker, who presents with fall, left ankle pain. S/P Left ankle irrigation and debridement and open reduction internal fixation trimalleolar fracture     Clinical Impressions Patient presenting with decreased Ind in self care,balance, functional mobility, transfers, endurance, and safety awareness. Patient reports being Ind at baseline and being caregiver for husband at home. She does have intermittent assistance of her daughter and her husband's son. She plans on asking them for more assist at discharge. Pt performs bed mobility without assistance. Pt stands with min guard and hops with min cues to maintain NWB on L LE 15' to bathroom for toilet transfer. Pt returning to bed in same manner as above with all needs within reach. Patient will benefit from acute OT to increase overall independence in the areas of ADLs, functional mobility, and safety awareness in order to safely discharge.     If plan is discharge home, recommend the following:   A little help with walking and/or transfers;A little help with bathing/dressing/bathroom;Assistance with cooking/housework;Assist for transportation;Help with stairs or ramp for entrance     Functional Status Assessment   Patient has had a recent decline in their functional status and demonstrates the ability to make significant improvements in function in a reasonable and predictable amount of time.     Equipment Recommendations   BSC/3in1;Wheelchair (measurements OT)      Precautions/Restrictions   Precautions Precautions: Fall Recall of Precautions/Restrictions: Impaired Restrictions Weight Bearing Restrictions Per Provider Order: Yes LLE  Weight Bearing Per Provider Order: Non weight bearing Other Position/Activity Restrictions: CAM boot donned     Mobility Bed Mobility Overal bed mobility: Modified Independent                  Transfers Overall transfer level: Needs assistance Equipment used: Rolling walker (2 wheels) Transfers: Sit to/from Stand Sit to Stand: Contact guard assist                  Balance Overall balance assessment: Needs assistance Sitting-balance support: Feet supported Sitting balance-Leahy Scale: Normal     Standing balance support: Bilateral upper extremity supported, During functional activity, Reliant on assistive device for balance Standing balance-Leahy Scale: Fair                             ADL either performed or assessed with clinical judgement   ADL Overall ADL's : Needs assistance/impaired                         Toilet Transfer: Ambulance Person;Ambulation;Rolling walker (2 wheels)                   Vision Patient Visual Report: No change from baseline              Pertinent Vitals/Pain Pain Assessment Pain Assessment: No/denies pain     Extremity/Trunk Assessment Upper Extremity Assessment Upper Extremity Assessment: Overall WFL for tasks assessed   Lower Extremity Assessment Lower Extremity Assessment: LLE deficits/detail LLE Deficits / Details: NWB LLE Coordination: decreased gross motor   Cervical / Trunk Assessment Cervical / Trunk Assessment: Normal   Communication Communication Communication: No apparent difficulties  Cognition Arousal: Alert Behavior During Therapy: WFL for tasks assessed/performed Cognition: No apparent impairments                               Following commands: Intact       Cueing  General Comments   Cueing Techniques: Verbal cues              Home Living Family/patient expects to be discharged to:: Private residence Living  Arrangements: Spouse/significant other Available Help at Discharge: Family;Available PRN/intermittently Type of Home: House Home Access: Ramped entrance     Home Layout: One level     Bathroom Shower/Tub: Tub/shower unit         Home Equipment: Agricultural Consultant (2 wheels)   Additional Comments: children in several times a week but pt is caregiver for husband      Prior Functioning/Environment Prior Level of Function : Independent/Modified Independent;Driving                    OT Problem List: Decreased strength;Decreased safety awareness;Decreased activity tolerance;Decreased knowledge of use of DME or AE;Impaired balance (sitting and/or standing);Cardiopulmonary status limiting activity   OT Treatment/Interventions: Self-care/ADL training;Therapeutic activities;Therapeutic exercise;Energy conservation;Patient/family education;Balance training;Manual therapy;DME and/or AE instruction      OT Goals(Current goals can be found in the care plan section)   Acute Rehab OT Goals Patient Stated Goal: to get home OT Goal Formulation: With patient Time For Goal Achievement: 01/02/25 Potential to Achieve Goals: Fair ADL Goals Pt Will Perform Grooming: with modified independence;sitting Pt Will Perform Lower Body Dressing: with modified independence;sitting/lateral leans Pt Will Transfer to Toilet: with modified independence;ambulating Pt Will Perform Toileting - Clothing Manipulation and hygiene: with modified independence;sitting/lateral leans   OT Frequency:  Min 2X/week    Co-evaluation PT/OT/SLP Co-Evaluation/Treatment: Yes Reason for Co-Treatment: For patient/therapist safety;To address functional/ADL transfers PT goals addressed during session: Mobility/safety with mobility;Balance;Proper use of DME OT goals addressed during session: ADL's and self-care      AM-PAC OT 6 Clicks Daily Activity     Outcome Measure Help from another person eating meals?:  None Help from another person taking care of personal grooming?: None Help from another person toileting, which includes using toliet, bedpan, or urinal?: A Little Help from another person bathing (including washing, rinsing, drying)?: A Little Help from another person to put on and taking off regular upper body clothing?: None Help from another person to put on and taking off regular lower body clothing?: A Little 6 Click Score: 21   End of Session Equipment Utilized During Treatment: Rolling walker (2 wheels) Nurse Communication: Mobility status  Activity Tolerance: Patient tolerated treatment well Patient left: in bed;with call bell/phone within reach;with bed alarm set  OT Visit Diagnosis: Unsteadiness on feet (R26.81);Repeated falls (R29.6);Muscle weakness (generalized) (M62.81)                Time: 8994-8974 OT Time Calculation (min): 20 min Charges:  OT General Charges $OT Visit: 1 Visit OT Evaluation $OT Eval Moderate Complexity: 1 [address] [place], OTR/L , CBIS ascom [phone]  12/19/2024, 11:30 AM

## 2024-12-20 ENCOUNTER — Encounter: Payer: Self-pay | Admitting: Podiatry

## 2024-12-23 ENCOUNTER — Telehealth: Payer: Self-pay | Admitting: Podiatry

## 2024-12-23 LAB — AEROBIC/ANAEROBIC CULTURE W GRAM STAIN (SURGICAL/DEEP WOUND)
Culture: NO GROWTH
Gram Stain: NONE SEEN

## 2024-12-23 NOTE — Telephone Encounter (Signed)
 Hadassah (physical therapist) from [facility] is requesting verbal orders for home health physical therapy. Her telephone number is [phone].

## 2024-12-25 ENCOUNTER — Ambulatory Visit (INDEPENDENT_AMBULATORY_CARE_PROVIDER_SITE_OTHER)

## 2024-12-25 ENCOUNTER — Ambulatory Visit (INDEPENDENT_AMBULATORY_CARE_PROVIDER_SITE_OTHER): Payer: Self-pay

## 2024-12-25 DIAGNOSIS — S82892D Other fracture of left lower leg, subsequent encounter for closed fracture with routine healing: Secondary | ICD-10-CM

## 2024-12-25 NOTE — Progress Notes (Signed)
 "   Subjective:  Patient ID: Kara Garza, female    DOB: 07/28/1946,  MRN: [id number]  Chief Complaint  Patient presents with   Routine Post Op    POV#1 DOS 12/18/24-Left OPEN REDUCTION INTERNAL FIXATION (ORIF) ANKLE FRACTURE Surgery was 1 week ago. She is doing good. No swelling noted    DOS: 12/18/24 Procedure: Left ankle ORIF  79 y.o. female returns for post-op check.  She is doing well in her recovery.  She has been compliant with nonweightbearing restrictions in a cam walker.  She relates to minimal pain.  She is frustrated by nonweightbearing requirement.  Review of Systems: Negative except as noted in the HPI. Denies N/V/F/Ch.  Past Medical History:  Diagnosis Date   Anemia    H/O   Anxiety    Asthma    PT DENIES BUT PCP HAS LISTED ON H&P   Chronic kidney disease    H/O KIDNEY STONES   Chronic renal failure, stage 3b (HCC)    Complication of anesthesia    panic attack during anesthesia for 1st cataract   COPD (chronic obstructive pulmonary disease) (HCC)    PT DENIES BUT PCP HAS LISTED ON H&P   Depression    Hypertension    PONV (postoperative nausea and vomiting)    NAUSEATED   Wears contact lenses    Wears dentures    partial upper, full lower   Current Medications[1]  Tobacco Use History[2]  Allergies[3] Objective:  There were no vitals filed for this visit. There is no height or weight on file to calculate BMI. Constitutional Well developed. Well nourished.  Vascular Foot warm and well perfused. Capillary refill normal to all digits.  No calf tenderness, erythema, edema, negative Homans  Neurologic Normal speech. Oriented to person, place, and time. Epicritic sensation to light touch grossly present bilaterally.  Dermatologic Skin healing well without signs of infection. Skin edges well coapted without signs of infection. Mild edema around surgical site.  Sutures and staples intact.  Orthopedic: Mild tenderness to palpation noted about the surgical  site within normal limits.   Radiographs: 3 views of the left ankle were acquired today.  These show plate and screw fixation of the fibula and screw fixation of the medial malleolus.  Fracture lines visible.  All hardware is well seated without complication.  Well reduced ankle fracture.  No complications identified. Assessment:   1. Closed fracture of left ankle with routine healing, subsequent encounter    Plan:  Patient was evaluated and treated and all questions answered. Doing well in post operative recovery.   S/p Left ankle surgery  -Progressing as expected post-operatively. She is doing very well in her recovery.  We did discuss that she has to continue nonweightbearing for approximately 3 more weeks.  We discussed that she will have her sutures and skin staples removed in 2 weeks.  She is to utilize the cam walker like a cast, nonweightbearing.  There are no signs of infection and she has completed her antibiotics. -XR: As above, no complication -WB Status: Non weight bearing in CAM walker. -Sutures: intact, will be removed at next post-op visit. -Medications: None needed at today's visit -Foot redressed. -XR at next visit: not needed  RTC 2 weeks for suture/skin staple removal     [1]  Current Outpatient Medications:    aspirin  EC 81 MG tablet, Take 1 tablet (81 mg total) by mouth 2 (two) times daily. Swallow whole., Disp: , Rfl:    gabapentin  (NEURONTIN )  300 MG capsule, Take 3 capsules (900 mg total) by mouth at bedtime., Disp: , Rfl:    lisinopril -hydrochlorothiazide  (PRINZIDE ,ZESTORETIC ) 20-12.5 MG tablet, Take 1 tablet by mouth daily., Disp: , Rfl:    nicotine  (NICODERM CQ  - DOSED IN MG/24 HOURS) 21 mg/24hr patch, Place 1 patch (21 mg total) onto the skin daily., Disp: , Rfl:    traMADol  (ULTRAM ) 50 MG tablet, Take 1 tablet (50 mg total) by mouth every 6 (six) hours as needed., Disp: , Rfl:    venlafaxine  XR (EFFEXOR -XR) 37.5 MG 24 hr capsule, Take 150 mg by mouth daily.,  Disp: , Rfl:  [2]  Social History Tobacco Use  Smoking Status Every Day   Current packs/day: 1.00   Average packs/day: 1 pack/day for 27.0 years (27.0 ttl pk-yrs)   Types: Cigarettes   Start date: 1999  Smokeless Tobacco Never  [3]  Allergies Allergen Reactions   Septra  [Sulfamethoxazole -Trimethoprim ] Other (See Comments)    severe nausea, terrible indigestion/heartburn, diarrhea, vomited a few times, sick to her stomach, breaking out in sweats and weakness/fatigue.   Amoxicillin  Diarrhea   Amoxicillin -Pot Clavulanate Other (See Comments)    Causes GI issues   Ivp Dye [Iodinated Contrast Media] Rash   "

## 2024-12-31 ENCOUNTER — Encounter: Payer: Self-pay | Admitting: Podiatry

## 2025-01-08 ENCOUNTER — Ambulatory Visit (INDEPENDENT_AMBULATORY_CARE_PROVIDER_SITE_OTHER)

## 2025-01-08 DIAGNOSIS — S82892D Other fracture of left lower leg, subsequent encounter for closed fracture with routine healing: Secondary | ICD-10-CM

## 2025-01-08 NOTE — Progress Notes (Signed)
 "   Subjective:  Patient ID: Kara Garza, female    DOB: [birth year],  MRN: [id number]  Chief Complaint  Patient presents with   Routine Post Op    Follow up outine Post Op      POV#1 DOS 12/18/24-Left OPEN REDUCTION INTERNAL FIXATION (ORIF) ANKLE FRACTURE      DOS: 12/18/24 Procedure: Left ankle ORIF with Dr. Malvin, DPM  79 y.o. female returns for second post-op check.  She is doing well in her recovery.  She has been compliant with nonweightbearing restrictions in a cam walker.  She relates to minimal pain.  She is frustrated by nonweightbearing requirement.  Review of Systems: Negative except as noted in the HPI. Denies N/V/F/Ch.  Past Medical History:  Diagnosis Date   Anemia    H/O   Anxiety    Asthma    PT DENIES BUT PCP HAS LISTED ON H&P   Chronic kidney disease    H/O KIDNEY STONES   Chronic renal failure, stage 3b (HCC)    Complication of anesthesia    panic attack during anesthesia for 1st cataract   COPD (chronic obstructive pulmonary disease) (HCC)    PT DENIES BUT PCP HAS LISTED ON H&P   Depression    Hypertension    PONV (postoperative nausea and vomiting)    NAUSEATED   Wears contact lenses    Wears dentures    partial upper, full lower   Current Medications[1]  Tobacco Use History[2]  Allergies[3] Objective:  There were no vitals filed for this visit. There is no height or weight on file to calculate BMI. Constitutional Well developed. Well nourished.  Vascular Foot warm and well perfused. Capillary refill normal to all digits.  No calf tenderness, erythema, edema, negative Homans  Neurologic Normal speech. Oriented to person, place, and time. Epicritic sensation to light touch grossly present bilaterally.  Dermatologic Skin healing well without signs of infection. Skin edges well coapted without signs of infection. Mild edema around surgical site, improved compared to last visit.  No dehiscence upon suture and staple removal. Mild  erythema to medial incision with stable eschar formation, no signs of active infection.  Orthopedic: Mild tenderness to palpation noted about the surgical site within normal limits.   Radiographs: Not taken today Assessment:   1. Closed fracture of left ankle with routine healing, subsequent encounter     Plan:  Patient was evaluated and treated and all questions answered. Doing well in post operative recovery.   S/p Left ankle surgery  -Progressing as expected post-operatively. She is doing very well in her recovery.  We discussed that she could begin partial weightbearing in 1 week.  She will gradually progress her weightbearing and let pain be her guide.  If she experiences pain, she is to decrease her activity.  She is to utilize the cam walker like a cast, nonweightbearing for 1 more week.  She can then begin to start partial weightbearing.  She may progress to full weightbearing by next visit.  There are no signs of infection and she has completed her antibiotics. -Patient does feel that she is getting some rubbing inside of the boot along the medial incision.  I provided her with ABD pads today so that she can cushion the area -Patient can begin to shower tomorrow.  She is to avoid baths or soaking the foot. -XR: As above, no complication -WB Status: Non weight bearing in CAM walker for 1 more week, then may begin partial weightbearing -Sutures:  Removed today without issue -Medications: None needed at today's visit -Foot redressed. -XR at next visit: Left ankle  RTC 4 weeks with preclinical left ankle x-rays Prentice Ovens, DPM    [1]  Current Outpatient Medications:    aspirin  EC 81 MG tablet, Take 1 tablet (81 mg total) by mouth 2 (two) times daily. Swallow whole., Disp: , Rfl:    gabapentin  (NEURONTIN ) 300 MG capsule, Take 3 capsules (900 mg total) by mouth at bedtime., Disp: , Rfl:    lisinopril -hydrochlorothiazide  (PRINZIDE ,ZESTORETIC ) 20-12.5 MG tablet, Take 1 tablet by  mouth daily., Disp: , Rfl:    nicotine  (NICODERM CQ  - DOSED IN MG/24 HOURS) 21 mg/24hr patch, Place 1 patch (21 mg total) onto the skin daily., Disp: , Rfl:    traMADol  (ULTRAM ) 50 MG tablet, Take 1 tablet (50 mg total) by mouth every 6 (six) hours as needed., Disp: , Rfl:    venlafaxine  XR (EFFEXOR -XR) 37.5 MG 24 hr capsule, Take 150 mg by mouth daily., Disp: , Rfl:  [2]  Social History Tobacco Use  Smoking Status Every Day   Current packs/day: 1.00   Average packs/day: 1 pack/day for 27.1 years (27.1 ttl pk-yrs)   Types: Cigarettes   Start date: 1999  Smokeless Tobacco Never  [3]  Allergies Allergen Reactions   Septra  [Sulfamethoxazole -Trimethoprim ] Other (See Comments)    severe nausea, terrible indigestion/heartburn, diarrhea, vomited a few times, sick to her stomach, breaking out in sweats and weakness/fatigue.   Amoxicillin  Diarrhea   Amoxicillin -Pot Clavulanate Other (See Comments)    Causes GI issues   Ivp Dye [Iodinated Contrast Media] Rash   "

## 2025-09-10 ENCOUNTER — Ambulatory Visit: Admitting: Urology
